# Patient Record
Sex: Male | Born: 1990 | Race: Black or African American | Hispanic: No | Marital: Single | Smoking: Current every day smoker
Health system: Southern US, Community
[De-identification: ages and names within clinical notes are randomized; demographics above are authoritative.]

## PROBLEM LIST (undated history)

## (undated) DIAGNOSIS — S62309A Unspecified fracture of unspecified metacarpal bone, initial encounter for closed fracture: Secondary | ICD-10-CM

## (undated) DIAGNOSIS — I213 ST elevation (STEMI) myocardial infarction of unspecified site: Secondary | ICD-10-CM

## (undated) DIAGNOSIS — M1990 Unspecified osteoarthritis, unspecified site: Secondary | ICD-10-CM

## (undated) DIAGNOSIS — I319 Disease of pericardium, unspecified: Secondary | ICD-10-CM

## (undated) HISTORY — PX: FRACTURE SURGERY: SHX138

---
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
## 2013-12-14 DIAGNOSIS — S62309A Unspecified fracture of unspecified metacarpal bone, initial encounter for closed fracture: Secondary | ICD-10-CM

## 2013-12-14 HISTORY — DX: Unspecified fracture of unspecified metacarpal bone, initial encounter for closed fracture: S62.309A

## 2013-12-15 ENCOUNTER — Encounter (HOSPITAL_BASED_OUTPATIENT_CLINIC_OR_DEPARTMENT_OTHER): Payer: Self-pay | Admitting: Emergency Medicine

## 2013-12-15 ENCOUNTER — Emergency Department (HOSPITAL_BASED_OUTPATIENT_CLINIC_OR_DEPARTMENT_OTHER): Payer: 59

## 2013-12-15 ENCOUNTER — Emergency Department (HOSPITAL_BASED_OUTPATIENT_CLINIC_OR_DEPARTMENT_OTHER)
Admission: EM | Admit: 2013-12-15 | Discharge: 2013-12-15 | Disposition: A | Discharge status: Home / Self Care | Payer: 59 | Attending: Emergency Medicine | Admitting: Emergency Medicine

## 2013-12-15 DIAGNOSIS — IMO0002 Reserved for concepts with insufficient information to code with codable children: Secondary | ICD-10-CM | POA: Insufficient documentation

## 2013-12-15 DIAGNOSIS — S62329A Displaced fracture of shaft of unspecified metacarpal bone, initial encounter for closed fracture: Secondary | ICD-10-CM | POA: Insufficient documentation

## 2013-12-15 DIAGNOSIS — Y929 Unspecified place or not applicable: Secondary | ICD-10-CM | POA: Insufficient documentation

## 2013-12-15 DIAGNOSIS — Y9389 Activity, other specified: Secondary | ICD-10-CM | POA: Insufficient documentation

## 2013-12-15 DIAGNOSIS — S62308A Unspecified fracture of other metacarpal bone, initial encounter for closed fracture: Secondary | ICD-10-CM

## 2013-12-15 MED ORDER — HYDROCODONE-ACETAMINOPHEN 5-325 MG PO TABS: 1.0000 | ORAL_TABLET | Freq: Four times a day (QID) | ORAL | Status: DC | PRN

## 2013-12-15 NOTE — Discharge Instructions (Signed)
Boxer's Fracture °You have a break (fracture) of the fifth metacarpal bone. This is commonly called a boxer's fracture. This is the bone in the hand where the little finger attaches. The fracture is in the end of that bone, closest to the little finger. It is usually caused when you hit an object with a clenched fist. Often, the knuckle is pushed down by the impact. Sometimes, the fracture rotates out of position. A boxer's fracture will usually heal within 6 weeks, if it is treated properly and protected from re-injury. Surgery is sometimes needed. °A cast, splint, or bulky hand dressing may be used to protect and immobilize a boxer's fracture. Do not remove this device or dressing until your caregiver approves. Keep your hand elevated, and apply ice packs for 15-20 minutes every 2 hours, for the first 2 days. Elevation and ice help reduce swelling and relieve pain. See your caregiver, or an orthopedic specialist, for follow-up care within the next 10 days. This is to make sure your fracture is healing properly. °Document Released: 08/14/2005 Document Revised: 11/06/2011 Document Reviewed: 02/01/2007 °ExitCare® Patient Information ©2014 [employer]. ° °
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
## 2013-12-24 ENCOUNTER — Encounter (HOSPITAL_BASED_OUTPATIENT_CLINIC_OR_DEPARTMENT_OTHER): Payer: Self-pay | Admitting: *Deleted

## 2013-12-26 ENCOUNTER — Other Ambulatory Visit: Payer: Self-pay | Admitting: Orthopedic Surgery

## 2013-12-30 ENCOUNTER — Emergency Department (HOSPITAL_BASED_OUTPATIENT_CLINIC_OR_DEPARTMENT_OTHER)
Admission: EM | Admit: 2013-12-30 | Discharge: 2013-12-30 | Disposition: A | Discharge status: Home / Self Care | Payer: 59 | Attending: Emergency Medicine | Admitting: Emergency Medicine

## 2013-12-30 ENCOUNTER — Encounter (HOSPITAL_BASED_OUTPATIENT_CLINIC_OR_DEPARTMENT_OTHER): Payer: Self-pay | Admitting: Emergency Medicine

## 2013-12-30 ENCOUNTER — Emergency Department (HOSPITAL_BASED_OUTPATIENT_CLINIC_OR_DEPARTMENT_OTHER): Payer: 59

## 2013-12-30 DIAGNOSIS — S161XXA Strain of muscle, fascia and tendon at neck level, initial encounter: Secondary | ICD-10-CM

## 2013-12-30 DIAGNOSIS — F17200 Nicotine dependence, unspecified, uncomplicated: Secondary | ICD-10-CM | POA: Insufficient documentation

## 2013-12-30 DIAGNOSIS — Y9389 Activity, other specified: Secondary | ICD-10-CM | POA: Insufficient documentation

## 2013-12-30 DIAGNOSIS — S139XXA Sprain of joints and ligaments of unspecified parts of neck, initial encounter: Secondary | ICD-10-CM | POA: Insufficient documentation

## 2013-12-30 DIAGNOSIS — Z8781 Personal history of (healed) traumatic fracture: Secondary | ICD-10-CM | POA: Insufficient documentation

## 2013-12-30 DIAGNOSIS — Y92410 Unspecified street and highway as the place of occurrence of the external cause: Secondary | ICD-10-CM | POA: Insufficient documentation

## 2013-12-30 MED ORDER — MELOXICAM 7.5 MG PO TABS: 7.5000 mg | ORAL_TABLET | Freq: Every day | ORAL | Status: DC

## 2013-12-30 MED ORDER — CYCLOBENZAPRINE HCL 10 MG PO TABS: 10.0000 mg | ORAL_TABLET | Freq: Two times a day (BID) | ORAL | Status: DC | PRN
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
## 2013-12-31 ENCOUNTER — Encounter (HOSPITAL_BASED_OUTPATIENT_CLINIC_OR_DEPARTMENT_OTHER): Payer: Self-pay | Admitting: *Deleted

## 2013-12-31 ENCOUNTER — Ambulatory Visit (HOSPITAL_BASED_OUTPATIENT_CLINIC_OR_DEPARTMENT_OTHER): Payer: 59 | Admitting: Anesthesiology

## 2013-12-31 ENCOUNTER — Ambulatory Visit (HOSPITAL_BASED_OUTPATIENT_CLINIC_OR_DEPARTMENT_OTHER)
Admission: RE | Admit: 2013-12-31 | Discharge: 2013-12-31 | Disposition: A | Discharge status: Home / Self Care | Payer: 59 | Source: Ambulatory Visit | Attending: Orthopedic Surgery | Admitting: Orthopedic Surgery

## 2013-12-31 ENCOUNTER — Encounter (HOSPITAL_BASED_OUTPATIENT_CLINIC_OR_DEPARTMENT_OTHER)
Admission: RE | Disposition: A | Discharge status: Home / Self Care | Payer: Self-pay | Source: Ambulatory Visit | Attending: Orthopedic Surgery

## 2013-12-31 ENCOUNTER — Encounter (HOSPITAL_BASED_OUTPATIENT_CLINIC_OR_DEPARTMENT_OTHER): Payer: 59 | Admitting: Anesthesiology

## 2013-12-31 DIAGNOSIS — X58XXXA Exposure to other specified factors, initial encounter: Secondary | ICD-10-CM | POA: Insufficient documentation

## 2013-12-31 DIAGNOSIS — S62309A Unspecified fracture of unspecified metacarpal bone, initial encounter for closed fracture: Secondary | ICD-10-CM | POA: Insufficient documentation

## 2013-12-31 DIAGNOSIS — F17200 Nicotine dependence, unspecified, uncomplicated: Secondary | ICD-10-CM | POA: Insufficient documentation

## 2013-12-31 HISTORY — DX: Unspecified fracture of unspecified metacarpal bone, initial encounter for closed fracture: S62.309A

## 2013-12-31 HISTORY — PX: OPEN REDUCTION INTERNAL FIXATION (ORIF) METACARPAL: SHX6234

## 2013-12-31 LAB — POCT HEMOGLOBIN-HEMACUE: Hemoglobin: 15.1 g/dL (ref 13.0–17.0)

## 2013-12-31 SURGERY — OPEN REDUCTION INTERNAL FIXATION (ORIF) METACARPAL
Anesthesia: General | Site: Finger | Laterality: Left

## 2013-12-31 MED ORDER — FENTANYL CITRATE 0.05 MG/ML IJ SOLN
INTRAMUSCULAR | Status: AC
  Filled 2013-12-31: qty 4

## 2013-12-31 MED ORDER — MEPERIDINE HCL 25 MG/ML IJ SOLN: 6.2500 mg | INTRAMUSCULAR | Status: DC | PRN

## 2013-12-31 MED ORDER — BUPIVACAINE HCL (PF) 0.25 % IJ SOLN
INTRAMUSCULAR | Status: DC | PRN
  Administered 2013-12-31: 10 mL

## 2013-12-31 MED ORDER — MIDAZOLAM HCL 2 MG/2ML IJ SOLN: 0.5000 mg | Freq: Once | INTRAMUSCULAR | Status: DC | PRN

## 2013-12-31 MED ORDER — LIDOCAINE HCL (PF) 1 % IJ SOLN
INTRAMUSCULAR | Status: AC
  Filled 2013-12-31: qty 150

## 2013-12-31 MED ORDER — MIDAZOLAM HCL 5 MG/5ML IJ SOLN
INTRAMUSCULAR | Status: DC | PRN
Start: 2013-12-31 — End: 2013-12-31
  Administered 2013-12-31: 2 mg via INTRAVENOUS

## 2013-12-31 MED ORDER — MIDAZOLAM HCL 2 MG/2ML IJ SOLN
INTRAMUSCULAR | Status: AC
  Filled 2013-12-31: qty 2

## 2013-12-31 MED ORDER — MIDAZOLAM HCL 2 MG/ML PO SYRP: 12.0000 mg | ORAL_SOLUTION | Freq: Once | ORAL | Status: DC | PRN

## 2013-12-31 MED ORDER — PROMETHAZINE HCL 25 MG/ML IJ SOLN: 6.2500 mg | INTRAMUSCULAR | Status: DC | PRN

## 2013-12-31 MED ORDER — CEFAZOLIN SODIUM-DEXTROSE 2-3 GM-% IV SOLR
2.0000 g | INTRAVENOUS | Status: AC
  Administered 2013-12-31: 2 g via INTRAVENOUS

## 2013-12-31 MED ORDER — LACTATED RINGERS IV SOLN
INTRAVENOUS | Status: DC
  Administered 2013-12-31 (×2): via INTRAVENOUS

## 2013-12-31 MED ORDER — LIDOCAINE HCL (CARDIAC) 20 MG/ML IV SOLN
INTRAVENOUS | Status: DC | PRN
  Administered 2013-12-31: 40 mg via INTRAVENOUS

## 2013-12-31 MED ORDER — PROPOFOL 10 MG/ML IV BOLUS
INTRAVENOUS | Status: AC
  Filled 2013-12-31: qty 80

## 2013-12-31 MED ORDER — MIDAZOLAM HCL 2 MG/2ML IJ SOLN: 1.0000 mg | INTRAMUSCULAR | Status: DC | PRN

## 2013-12-31 MED ORDER — CHLORHEXIDINE GLUCONATE 4 % EX LIQD: 60.0000 mL | Freq: Once | CUTANEOUS | Status: DC

## 2013-12-31 MED ORDER — DEXAMETHASONE SODIUM PHOSPHATE 10 MG/ML IJ SOLN
INTRAMUSCULAR | Status: DC | PRN
  Administered 2013-12-31: 10 mg via INTRAVENOUS

## 2013-12-31 MED ORDER — OXYCODONE HCL 5 MG PO TABS: 5.0000 mg | ORAL_TABLET | Freq: Once | ORAL | Status: DC | PRN

## 2013-12-31 MED ORDER — BUPIVACAINE HCL (PF) 0.25 % IJ SOLN
INTRAMUSCULAR | Status: AC
  Filled 2013-12-31: qty 150

## 2013-12-31 MED ORDER — EPHEDRINE SULFATE 50 MG/ML IJ SOLN
INTRAMUSCULAR | Status: DC | PRN
  Administered 2013-12-31: 10 mg via INTRAVENOUS

## 2013-12-31 MED ORDER — OXYCODONE HCL 5 MG/5ML PO SOLN: 5.0000 mg | Freq: Once | ORAL | Status: DC | PRN

## 2013-12-31 MED ORDER — FENTANYL CITRATE 0.05 MG/ML IJ SOLN
INTRAMUSCULAR | Status: DC | PRN
Start: 2013-12-31 — End: 2013-12-31
  Administered 2013-12-31 (×2): 25 ug via INTRAVENOUS
  Administered 2013-12-31: 100 ug via INTRAVENOUS

## 2013-12-31 MED ORDER — OXYCODONE-ACETAMINOPHEN 5-325 MG PO TABS: 1.0000 | ORAL_TABLET | ORAL | Status: DC | PRN

## 2013-12-31 MED ORDER — CEFAZOLIN SODIUM-DEXTROSE 2-3 GM-% IV SOLR
INTRAVENOUS | Status: AC
  Filled 2013-12-31: qty 50

## 2013-12-31 MED ORDER — HYDROMORPHONE HCL PF 1 MG/ML IJ SOLN: 0.2500 mg | INTRAMUSCULAR | Status: DC | PRN

## 2013-12-31 MED ORDER — FENTANYL CITRATE 0.05 MG/ML IJ SOLN: 50.0000 ug | INTRAMUSCULAR | Status: DC | PRN

## 2013-12-31 MED ORDER — PROPOFOL 10 MG/ML IV BOLUS
INTRAVENOUS | Status: DC | PRN
  Administered 2013-12-31: 200 mg via INTRAVENOUS

## 2013-12-31 MED ORDER — ONDANSETRON HCL 4 MG/2ML IJ SOLN
INTRAMUSCULAR | Status: DC | PRN
  Administered 2013-12-31: 4 mg via INTRAVENOUS

## 2013-12-31 SURGICAL SUPPLY — 71 items
APL SKNCLS STERI-STRIP NONHPOA (GAUZE/BANDAGES/DRESSINGS)
BANDAGE ELASTIC 3 VELCRO ST LF (GAUZE/BANDAGES/DRESSINGS) ×3 IMPLANT
BANDAGE ELASTIC 4 VELCRO ST LF (GAUZE/BANDAGES/DRESSINGS) IMPLANT
BENZOIN TINCTURE PRP APPL 2/3 (GAUZE/BANDAGES/DRESSINGS) IMPLANT
BIT DRILL 1.1 (BIT) ×2
BIT DRILL 1.1MM (BIT) ×1
BIT DRILL 60X20X1.1XQC TMX (BIT) ×1 IMPLANT
BIT DRL 60X20X1.1XQC TMX (BIT) ×1
BLADE 15 SAFETY STRL DISP (BLADE) ×6 IMPLANT
BNDG CMPR 9X4 STRL LF SNTH (GAUZE/BANDAGES/DRESSINGS) ×1
BNDG CMPR MD 5X2 ELC HKLP STRL (GAUZE/BANDAGES/DRESSINGS)
BNDG ELASTIC 2 VLCR STRL LF (GAUZE/BANDAGES/DRESSINGS) IMPLANT
BNDG ESMARK 4X9 LF (GAUZE/BANDAGES/DRESSINGS) ×3 IMPLANT
BNDG GAUZE ELAST 4 BULKY (GAUZE/BANDAGES/DRESSINGS) IMPLANT
CANISTER SUCT 1200ML W/VALVE (MISCELLANEOUS) IMPLANT
CLOSURE WOUND 1/2 X4 (GAUZE/BANDAGES/DRESSINGS)
CORDS BIPOLAR (ELECTRODE) ×3 IMPLANT
COVER TABLE BACK 60X90 (DRAPES) ×3 IMPLANT
CUFF TOURNIQUET SINGLE 18IN (TOURNIQUET CUFF) ×3 IMPLANT
DECANTER SPIKE VIAL GLASS SM (MISCELLANEOUS) IMPLANT
DRAPE EXTREMITY T 121X128X90 (DRAPE) ×3 IMPLANT
DRAPE OEC MINIVIEW 54X84 (DRAPES) ×3 IMPLANT
DRAPE SURG 17X23 STRL (DRAPES) ×3 IMPLANT
DURAPREP 26ML APPLICATOR (WOUND CARE) ×3 IMPLANT
GAUZE SPONGE 4X4 12PLY STRL (GAUZE/BANDAGES/DRESSINGS) ×3 IMPLANT
GAUZE SPONGE 4X4 16PLY XRAY LF (GAUZE/BANDAGES/DRESSINGS) IMPLANT
GAUZE XEROFORM 1X8 LF (GAUZE/BANDAGES/DRESSINGS) IMPLANT
GLOVE BIO SURGEON STRL SZ 6.5 (GLOVE) ×2 IMPLANT
GLOVE BIO SURGEONS STRL SZ 6.5 (GLOVE) ×1
GLOVE BIOGEL PI IND STRL 7.0 (GLOVE) ×1 IMPLANT
GLOVE BIOGEL PI INDICATOR 7.0 (GLOVE) ×2
GLOVE SURG SYN 8.0 (GLOVE) ×6 IMPLANT
GOWN STRL REUS W/ TWL LRG LVL3 (GOWN DISPOSABLE) ×1 IMPLANT
GOWN STRL REUS W/TWL LRG LVL3 (GOWN DISPOSABLE) ×3
GOWN STRL REUS W/TWL XL LVL3 (GOWN DISPOSABLE) ×3 IMPLANT
NEEDLE HYPO 25X1 1.5 SAFETY (NEEDLE) IMPLANT
NS IRRIG 1000ML POUR BTL (IV SOLUTION) ×3 IMPLANT
PACK BASIN DAY SURGERY FS (CUSTOM PROCEDURE TRAY) ×3 IMPLANT
PAD CAST 3X4 CTTN HI CHSV (CAST SUPPLIES) ×1 IMPLANT
PAD CAST 4YDX4 CTTN HI CHSV (CAST SUPPLIES) IMPLANT
PADDING CAST ABS 4INX4YD NS (CAST SUPPLIES)
PADDING CAST ABS COTTON 4X4 ST (CAST SUPPLIES) IMPLANT
PADDING CAST COTTON 3X4 STRL (CAST SUPPLIES) ×3
PADDING CAST COTTON 4X4 STRL (CAST SUPPLIES)
PADDING UNDERCAST 2 STRL (CAST SUPPLIES) ×2
PADDING UNDERCAST 2X4 STRL (CAST SUPPLIES) ×1 IMPLANT
PLATE STRAIGHT LOCK 1.5 (Plate) ×3 IMPLANT
SCREW CORTICAL 1.5X9MM WRIST (Screw) ×6 IMPLANT
SCREW NL 1.5X11 WRIST (Screw) ×3 IMPLANT
SCREW NONIOC 1.5 10M (Screw) ×9 IMPLANT
SHEET MEDIUM DRAPE 40X70 STRL (DRAPES) ×3 IMPLANT
SPLINT PLASTER CAST XFAST 4X15 (CAST SUPPLIES) ×15 IMPLANT
SPLINT PLASTER XTRA FAST SET 4 (CAST SUPPLIES) ×30
STOCKINETTE 4X48 STRL (DRAPES) ×3 IMPLANT
STRIP CLOSURE SKIN 1/2X4 (GAUZE/BANDAGES/DRESSINGS) IMPLANT
SUCTION FRAZIER TIP 10 FR DISP (SUCTIONS) IMPLANT
SUT ETHILON 4 0 PS 2 18 (SUTURE) IMPLANT
SUT ETHILON 5 0 PS 2 18 (SUTURE) IMPLANT
SUT MERSILENE 4 0 P 3 (SUTURE) IMPLANT
SUT VIC AB 2-0 PS2 27 (SUTURE) ×3 IMPLANT
SUT VIC AB 4-0 P-3 18XBRD (SUTURE) IMPLANT
SUT VIC AB 4-0 P3 18 (SUTURE)
SUT VICRYL 4-0 PS2 18IN ABS (SUTURE) ×3 IMPLANT
SUT VICRYL RAPIDE 4-0 (SUTURE) ×3 IMPLANT
SUT VICRYL RAPIDE 4/0 PS 2 (SUTURE) IMPLANT
SYR BULB 3OZ (MISCELLANEOUS) IMPLANT
SYRINGE 10CC LL (SYRINGE) IMPLANT
TOWEL OR 17X24 6PK STRL BLUE (TOWEL DISPOSABLE) ×3 IMPLANT
TUBE CONNECTING 20'X1/4 (TUBING)
TUBE CONNECTING 20X1/4 (TUBING) IMPLANT
UNDERPAD 30X30 INCONTINENT (UNDERPADS AND DIAPERS) ×3 IMPLANT

## 2013-12-31 NOTE — Op Note (Signed)
See note [phone]

## 2013-12-31 NOTE — Discharge Instructions (Signed)
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
## 2013-12-31 NOTE — Anesthesia Postprocedure Evaluation (Signed)
  Anesthesia Post-op Note  Patient: Donald Buck  Procedure(s) Performed: Procedure(s): OPEN REDUCTION INTERNAL FIXATION (ORIF) LEFT SMALL FINGER METACARPAL FRACTURE (Left)  Patient Location: PACU  Anesthesia Type:General  Level of Consciousness: awake, alert , oriented and patient cooperative  Airway and Oxygen Therapy: Patient Spontanous Breathing  Post-op Pain: none  Post-op Assessment: Post-op Vital signs reviewed, Patient's Cardiovascular Status Stable, Respiratory Function Stable, Patent Airway, No signs of Nausea or vomiting and Pain level controlled  Post-op Vital Signs: Reviewed and stable  Last Vitals:  Filed Vitals:   12/31/13 1042  BP: 128/81  Pulse: 74  Temp: 36.8 C  Resp: 16    Complications: No apparent anesthesia complications

## 2013-12-31 NOTE — Anesthesia Preprocedure Evaluation (Addendum)
Anesthesia Evaluation  Patient identified by MRN, date of birth, ID band Patient awake    Reviewed: Allergy & Precautions, H&P , NPO status , Patient's Chart, lab work & pertinent test results  History of Anesthesia Complications Negative for: history of anesthetic complications  Airway Mallampati: I TM Distance: >3 FB Neck ROM: Full    Dental  (+) Teeth Intact, Dental Advisory Given   Pulmonary Current Smoker,  breath sounds clear to auscultation  Pulmonary exam normal       Cardiovascular negative cardio ROS  Rhythm:Regular Rate:Normal     Neuro/Psych negative neurological ROS  negative psych ROS   GI/Hepatic negative GI ROS, Neg liver ROS,   Endo/Other  negative endocrine ROS  Renal/GU negative Renal ROS     Musculoskeletal   Abdominal   Peds  Hematology negative hematology ROS (+)   Anesthesia Other Findings   Reproductive/Obstetrics                          Anesthesia Physical Anesthesia Plan  ASA: II  Anesthesia Plan: General   Post-op Pain Management:    Induction: Intravenous  Airway Management Planned: LMA  Additional Equipment:   Intra-op Plan:   Post-operative Plan:   Informed Consent: I have reviewed the patients History and Physical, chart, labs and discussed the procedure including the risks, benefits and alternatives for the proposed anesthesia with the patient or authorized representative who has indicated his/her understanding and acceptance.   Dental advisory given  Plan Discussed with: CRNA and Surgeon  Anesthesia Plan Comments: (Plan routine monitors, GA- LMA OK)        Anesthesia Quick Evaluation

## 2013-12-31 NOTE — Anesthesia Procedure Notes (Signed)
Procedure Name: LMA Insertion Date/Time: 12/31/2013 8:37 AM Performed by: Burna CashONRAD, Corbet Hanley C Pre-anesthesia Checklist: Patient identified, Emergency Drugs available, Suction available and Patient being monitored Patient Re-evaluated:Patient Re-evaluated prior to inductionOxygen Delivery Method: Circle System Utilized Preoxygenation: Pre-oxygenation with 100% oxygen Intubation Type: IV induction Ventilation: Mask ventilation without difficulty LMA: LMA inserted LMA Size: 5.0 Number of attempts: 1 Airway Equipment and Method: bite block Placement Confirmation: positive ETCO2 Tube secured with: Tape Dental Injury: Teeth and Oropharynx as per pre-operative assessment

## 2013-12-31 NOTE — Transfer of Care (Signed)
Immediate Anesthesia Transfer of Care Note  Patient: Donald Buck  Procedure(s) Performed: Procedure(s): OPEN REDUCTION INTERNAL FIXATION (ORIF) LEFT SMALL FINGER METACARPAL FRACTURE (Left)  Patient Location: PACU  Anesthesia Type:General  Level of Consciousness: sedated  Airway & Oxygen Therapy: Patient Spontanous Breathing and Patient connected to face mask oxygen  Post-op Assessment: Report given to PACU RN and Post -op Vital signs reviewed and stable  Post vital signs: Reviewed and stable  Complications: No apparent anesthesia complications

## 2014-01-01 ENCOUNTER — Encounter (HOSPITAL_BASED_OUTPATIENT_CLINIC_OR_DEPARTMENT_OTHER): Payer: Self-pay | Admitting: Orthopedic Surgery

## 2014-01-01 NOTE — Op Note (Signed)
NAME:  Donald Buck, Donald Buck                ACCOUNT NO.:  [id number]  MEDICAL RECORD NO.:  [id number]  LOCATION:                               FACILITY:  [facility]  PHYSICIAN:  Artist PaisMatthew A. Pax Reasoner, M.D.DATE OF BIRTH:  16-Nov-1990  DATE OF PROCEDURE:  12/31/2013 DATE OF DISCHARGE:  12/31/2013                              OPERATIVE REPORT   PREOPERATIVE DIAGNOSIS:  Displaced right small finger metacarpal fracture.  POSTOPERATIVE DIAGNOSIS:  Displaced right small finger metacarpal fracture.  PROCEDURE:  Open reduction and internal fixation of above with 1.5 mm six-hole compression plating.  SURGEON:  Artist PaisMatthew A. Mina MarbleWeingold, M.D.  ASSISTANT:  None.  ANESTHESIA:  General.  COMPLICATION:  None.  DRAINS:  None.  DESCRIPTION OF PROCEDURE:  The patient was taken to the operating suite. After induction of adequate general anesthesia, right upper extremity was prepped and draped in usual sterile fashion.  An Esmarch was used to exsanguinate the limb.  Tourniquet was inflated to 250 mmHg.  At this point in time, incision was made over the mid shaft area of the small metacarpal.  Skin was incised 3-4 cm.  Dissection was carried down to the EDQ and EDC to the small.  These were carefully retracted radially. Subperiosteal dissection of the fracture site was undertaken.  The fracture was debrided of clot, reduced with downward pressure.  However, the reduction clamp at this point in time is 6 hole.  A 1.5-mm straight plate was placed dorsal ulnarly with 3 screws above and 3 screws below the fracture site.  These were placed under direct and fluoroscopic guidance.  The intraoperative fluoroscopy revealed adequate reduction in AP, lateral, and oblique view.  The wound was thoroughly irrigated and loosely closed in layers of 2-0 undyed Vicryl for the periosteum and a 4- 0 Vicryl Rapide subcuticular stitch on the skin.  Steri-Strips, 4x4s, fluffs, and a ulnar gutter splint was applied.  The patient  tolerated the procedure well in a concealed fashion.     Artist PaisMatthew A. Mina MarbleWeingold, M.D.    MAW/MEDQ  D:  12/31/2013  T:  01/01/2014  Job:  [id number]

## 2014-01-01 NOTE — Op Note (Deleted)
NAME:  Donald Buck, Donald Buck                ACCOUNT NO.:  [id number]  MEDICAL RECORD NO.:  [id number]  LOCATION:                               FACILITY:  [facility]  PHYSICIAN:  Adaliz Dobis A. Kalasia Crafton, M.D.DATE OF BIRTH:  01/14/1991  DATE OF PROCEDURE:  12/31/2013 DATE OF DISCHARGE:  12/31/2013                              OPERATIVE REPORT   PREOPERATIVE DIAGNOSIS:  Displaced right small finger metacarpal fracture.  POSTOPERATIVE DIAGNOSIS:  Displaced right small finger metacarpal fracture.  PROCEDURE:  Open reduction and internal fixation of above with 1.5 mm six-hole compression plating.  SURGEON:  Jenice Leiner A. Stanisha Lorenz, M.D.  ASSISTANT:  None.  ANESTHESIA:  General.  COMPLICATION:  None.  DRAINS:  None.  DESCRIPTION OF PROCEDURE:  The patient was taken to the operating suite. After induction of adequate general anesthesia, right upper extremity was prepped and draped in usual sterile fashion.  An Esmarch was used to exsanguinate the limb.  Tourniquet was inflated to 250 mmHg.  At this point in time, incision was made over the mid shaft area of the small metacarpal.  Skin was incised 3-4 cm.  Dissection was carried down to the EDQ and EDC to the small.  These were carefully retracted radially. Subperiosteal dissection of the fracture site was undertaken.  The fracture was debrided of clot, reduced with downward pressure.  However, the reduction clamp at this point in time is 6 hole.  A 1.5-mm straight plate was placed dorsal ulnarly with 3 screws above and 3 screws below the fracture site.  These were placed under direct and fluoroscopic guidance.  The intraoperative fluoroscopy revealed adequate reduction in AP, lateral, and oblique view.  The wound was thoroughly irrigated and loosely closed in layers of 2-0 undyed Vicryl for the periosteum and a 4- 0 Vicryl Rapide subcuticular stitch on the skin.  Steri-Strips, 4x4s, fluffs, and a ulnar gutter splint was applied.  The patient  tolerated the procedure well in a concealed fashion.     Estela Vinal A. Kohen Reither, M.D.    MAW/MEDQ  D:  12/31/2013  T:  01/01/2014  Job:  [id number] 

## 2014-01-03 NOTE — ED Provider Notes (Signed)
Medical screening examination/treatment/procedure(s) were performed by non-physician practitioner and as supervising physician I was immediately available for consultation/collaboration.     Jenah Vanasten, MD 01/03/14 1932 

## 2016-05-22 ENCOUNTER — Emergency Department (HOSPITAL_BASED_OUTPATIENT_CLINIC_OR_DEPARTMENT_OTHER): Payer: Self-pay

## 2016-05-22 ENCOUNTER — Emergency Department (HOSPITAL_BASED_OUTPATIENT_CLINIC_OR_DEPARTMENT_OTHER)
Admission: EM | Admit: 2016-05-22 | Discharge: 2016-05-22 | Disposition: A | Discharge status: Home / Self Care | Payer: Self-pay | Attending: Emergency Medicine | Admitting: Emergency Medicine

## 2016-05-22 ENCOUNTER — Encounter (HOSPITAL_BASED_OUTPATIENT_CLINIC_OR_DEPARTMENT_OTHER): Payer: Self-pay | Admitting: *Deleted

## 2016-05-22 DIAGNOSIS — M25562 Pain in left knee: Secondary | ICD-10-CM | POA: Insufficient documentation

## 2016-05-22 DIAGNOSIS — F17290 Nicotine dependence, other tobacco product, uncomplicated: Secondary | ICD-10-CM | POA: Insufficient documentation

## 2016-05-22 MED ORDER — IBUPROFEN 400 MG PO TABS
600.0000 mg | ORAL_TABLET | Freq: Once | ORAL | Status: AC
  Administered 2016-05-22: 600 mg via ORAL
  Filled 2016-05-22: qty 1

## 2016-05-22 NOTE — ED Triage Notes (Signed)
Left knee pain x 2-3 days.  Hx of surgery 3 years ago.

## 2016-05-22 NOTE — ED Provider Notes (Signed)
[facility] Provider Note   CSN: [id number] Arrival date & time: 05/22/16  1413 By signing my name below, I, Bridgette HabermannMaria Tan, attest that this documentation has Buck prepared under the direction and in the presence of Tomasita CrumbleAdeleke Jaicion Laurie, MD. Electronically Signed: Bridgette HabermannMaria Tan, ED Scribe. 05/22/16. 3:34 PM.  History   Chief Complaint Chief Complaint  Patient presents with  . Knee Pain   HPI Comments: Donald Buck is a 25 y.o. male who presents to the Emergency Department complaining of aching, left knee pain s/p mechanical injury 3 days ago. Pain is exacerbated with movement and ambulating. No alleviating factors noted. Pt has h/o surgery to the area 3 years ago. Pt denies numbness, fever, or any other associated symptoms.  The history is provided by the patient. No language interpreter was used.    Past Medical History:  Diagnosis Date  . Metacarpal bone fracture 12/14/2013   left small    There are no active problems to display for this patient.   Past Surgical History:  Procedure Laterality Date  . KNEE ARTHROSCOPY W/ ACL RECONSTRUCTION Left    and meniscus repair  . OPEN REDUCTION INTERNAL FIXATION (ORIF) METACARPAL Left 12/31/2013   Procedure: OPEN REDUCTION INTERNAL FIXATION (ORIF) LEFT SMALL FINGER METACARPAL FRACTURE;  Surgeon: Marlowe ShoresMatthew A Weingold, MD;  Location: [facility];  Service: Orthopedics;  Laterality: Left;       Home Medications    Prior to Admission medications   Not on File    Family History History reviewed. No pertinent family history.  Social History Social History  Substance Use Topics  . Smoking status: Current Every Day Smoker    Years: 3.00    Types: Cigars  . Smokeless tobacco: Never Used     Comment: 1-2 Black and Mild/day  . Alcohol use No     Allergies   Review of patient's allergies indicates no known allergies.   Review of Systems Review of Systems  10 Systems reviewed and all are negative for acute change  except as noted in the HPI. Physical Exam Updated Vital Signs BP 143/92 (BP Location: Left Arm)   Pulse 62   Temp 98.1 F (36.7 C) (Oral)   Resp 20   Ht 6\' 3"  (1.905 m)   Wt 170 lb (77.1 kg)   SpO2 100%   BMI 21.25 kg/m   Physical Exam  Constitutional: He is oriented to person, place, and time. Vital signs are normal. He appears well-developed and well-nourished.  Non-toxic appearance. He does not appear ill. No distress.  HENT:  Head: Normocephalic and atraumatic.  Nose: Nose normal.  Mouth/Throat: Oropharynx is clear and moist. No oropharyngeal exudate.  Eyes: Conjunctivae and EOM are normal. Pupils are equal, round, and reactive to light. No scleral icterus.  Neck: Normal range of motion. Neck supple. No tracheal deviation, no edema, no erythema and normal range of motion present. No thyroid mass and no thyromegaly present.  Cardiovascular: Normal rate, regular rhythm, S1 normal, S2 normal, normal heart sounds, intact distal pulses and normal pulses.  Exam reveals no gallop and no friction rub.   No murmur heard. Pulmonary/Chest: Effort normal and breath sounds normal. No respiratory distress. He has no wheezes. He has no rhonchi. He has no rales.  Abdominal: Soft. Normal appearance and bowel sounds are normal. He exhibits no distension, no ascites and no mass. There is no hepatosplenomegaly. There is no tenderness. There is no rebound, no guarding and no CVA tenderness.  Musculoskeletal: Normal range  of motion. He exhibits no edema.  Mild joint laxity in the left knee. No joint effusion.  Lymphadenopathy:    He has no cervical adenopathy.  Neurological: He is alert and oriented to person, place, and time. He has normal strength. No cranial nerve deficit or sensory deficit.  Skin: Skin is warm, dry and intact. No petechiae and no rash noted. He is not diaphoretic. No erythema. No pallor.  Nursing note and vitals reviewed.    ED Treatments / Results  DIAGNOSTIC STUDIES: Oxygen  Saturation is 100% on RA, normal by my interpretation.    COORDINATION OF CARE: 3:33 PM Discussed treatment plan with pt at bedside which includes x-ray and pt agreed to plan.  Labs (all labs ordered are listed, but only abnormal results are displayed) Labs Reviewed - No data to display  EKG  EKG Interpretation None       Radiology Dg Knee Complete 4 Views Left  Result Date: 05/22/2016 CLINICAL DATA:  Medial knee joint pain and hand pain with walking after trauma against door 2-3 days ago. Status post ACL repair 4 years ago. EXAM: LEFT KNEE - COMPLETE 4+ VIEW COMPARISON:  None. FINDINGS: Ghost tracts from ACL repair noted. Single femoral screw is identified in the distal femoral diaphysis. Additional ghost tract is seen in the medial aspect of the proximal tibial diametaphyseal. No acute fracture noted. 7 x 4 mm ossification adjacent to the medial tibial spine likely represents a small intra-articular loose body. Mild pretibial soft tissue thickening is seen. No joint effusion. IMPRESSION: Status post ACL reconstruction without evidence of acute fracture. 7 x 4 mm ossification adjacent to the medial tibial spine likely represents a small intra-articular loose body. Electronically Signed   By: Tollie Eth M.D.   On: 05/22/2016 16:07    Procedures Procedures (including critical care time)  Medications Ordered in ED Medications  ibuprofen (ADVIL,MOTRIN) tablet 600 mg (600 mg Oral Given 05/22/16 1545)     Initial Impression / Assessment and Plan / ED Course  I have reviewed the triage vital signs and the nursing notes.  Pertinent labs & imaging results that were available during my care of the patient were reviewed by me and considered in my medical decision making (see chart for details).  Clinical Course    Patient presents to the ED for knee pain after trauma.  This is in the setting of playing basketball and injuring it.  Physical exam shows some laxaity of the joint, concerning  for a repeat tear.  He was advised to follow up with ortho for MRI and further evaluation.  Plan for tylenol and ibuprofen at home as needed.  Ice packs for swelling.  Xray negative for new acute injury.  He appears well and in NAD.  VS remain within his normal limits and he is safe for DC.  Final Clinical Impressions(s) / ED Diagnoses   Final diagnoses:  None   I personally performed the services described in this documentation, which was scribed in my presence. The recorded information has Buck reviewed and is accurate.     New Prescriptions New Prescriptions   No medications on file     Tomasita Crumble, MD 05/22/16 1614

## 2016-05-22 NOTE — ED Notes (Signed)
Pt d/c home with friend. Follow up care discussed

## 2016-05-22 NOTE — ED Triage Notes (Signed)
Hit knee on door and played basketball

## 2018-03-06 ENCOUNTER — Other Ambulatory Visit: Payer: Self-pay

## 2018-03-06 ENCOUNTER — Encounter (HOSPITAL_COMMUNITY)
Admission: EM | Disposition: A | Discharge status: Home / Self Care | Payer: Self-pay | Source: Home / Self Care | Attending: Cardiology

## 2018-03-06 ENCOUNTER — Inpatient Hospital Stay (HOSPITAL_BASED_OUTPATIENT_CLINIC_OR_DEPARTMENT_OTHER)
Admission: EM | Admit: 2018-03-06 | Discharge: 2018-03-07 | DRG: 282 | Disposition: A | Discharge status: Home / Self Care | Payer: Self-pay | Attending: Cardiology | Admitting: Cardiology

## 2018-03-06 ENCOUNTER — Encounter (HOSPITAL_BASED_OUTPATIENT_CLINIC_OR_DEPARTMENT_OTHER): Payer: Self-pay | Admitting: *Deleted

## 2018-03-06 ENCOUNTER — Emergency Department (HOSPITAL_BASED_OUTPATIENT_CLINIC_OR_DEPARTMENT_OTHER): Payer: Self-pay

## 2018-03-06 DIAGNOSIS — R9431 Abnormal electrocardiogram [ECG] [EKG]: Secondary | ICD-10-CM | POA: Diagnosis present

## 2018-03-06 DIAGNOSIS — Z23 Encounter for immunization: Secondary | ICD-10-CM

## 2018-03-06 DIAGNOSIS — Z79899 Other long term (current) drug therapy: Secondary | ICD-10-CM

## 2018-03-06 DIAGNOSIS — I213 ST elevation (STEMI) myocardial infarction of unspecified site: Principal | ICD-10-CM

## 2018-03-06 DIAGNOSIS — R001 Bradycardia, unspecified: Secondary | ICD-10-CM | POA: Diagnosis present

## 2018-03-06 DIAGNOSIS — Z720 Tobacco use: Secondary | ICD-10-CM

## 2018-03-06 HISTORY — DX: ST elevation (STEMI) myocardial infarction of unspecified site: I21.3

## 2018-03-06 HISTORY — DX: Unspecified osteoarthritis, unspecified site: M19.90

## 2018-03-06 HISTORY — PX: LEFT HEART CATH AND CORONARY ANGIOGRAPHY: CATH118249

## 2018-03-06 LAB — COMPREHENSIVE METABOLIC PANEL
ALT: 14 U/L (ref 0–44)
AST: 19 U/L (ref 15–41)
Albumin: 4.3 g/dL (ref 3.5–5.0)
Alkaline Phosphatase: 49 U/L (ref 38–126)
Anion gap: 7 (ref 5–15)
BUN: 13 mg/dL (ref 6–20)
CALCIUM: 9 mg/dL (ref 8.9–10.3)
CO2: 24 mmol/L (ref 22–32)
Chloride: 107 mmol/L (ref 98–111)
Creatinine, Ser: 0.93 mg/dL (ref 0.61–1.24)
GFR calc Af Amer: >60 mL/min (ref >60)
Glucose, Bld: 86 mg/dL (ref 70–99)
Potassium: 3.4 mmol/L — ABNORMAL LOW (ref 3.5–5.1)
Sodium: 138 mmol/L (ref 135–145)
TOTAL PROTEIN: 7.7 g/dL (ref 6.5–8.1)
Total Bilirubin: 1.6 mg/dL — ABNORMAL HIGH (ref 0.3–1.2)

## 2018-03-06 LAB — LIPID PANEL
CHOL/HDL RATIO: 3.7 ratio
CHOLESTEROL: 187 mg/dL (ref 0–200)
HDL: 51 mg/dL (ref >40)
LDL Cholesterol: 127 mg/dL — ABNORMAL HIGH (ref 0–99)
Triglycerides: 47 mg/dL (ref <150)
VLDL: 9 mg/dL (ref 0–40)

## 2018-03-06 LAB — CBC WITH DIFFERENTIAL/PLATELET
BASOS PCT: 0 %
Basophils Absolute: 0 10*3/uL (ref 0.0–0.1)
EOS PCT: 1 %
Eosinophils Absolute: 0.1 10*3/uL (ref 0.0–0.7)
HEMATOCRIT: 42.2 % (ref 39.0–52.0)
Hemoglobin: 15.6 g/dL (ref 13.0–17.0)
Lymphocytes Relative: 17 %
Lymphs Abs: 1.2 10*3/uL (ref 0.7–4.0)
MCH: 34.1 pg — AB (ref 26.0–34.0)
MCHC: 37 g/dL — AB (ref 30.0–36.0)
MCV: 92.1 fL (ref 78.0–100.0)
MONOS PCT: 5 %
Monocytes Absolute: 0.3 10*3/uL (ref 0.1–1.0)
NEUTROS ABS: 5.3 10*3/uL (ref 1.7–7.7)
Neutrophils Relative %: 77 %
Platelets: 242 10*3/uL (ref 150–400)
RBC: 4.58 MIL/uL (ref 4.22–5.81)
RDW: 11.2 % — AB (ref 11.5–15.5)
WBC: 6.9 10*3/uL (ref 4.0–10.5)

## 2018-03-06 LAB — POCT I-STAT, CHEM 8
BUN: 12 mg/dL (ref 6–20)
CREATININE: 0.9 mg/dL (ref 0.61–1.24)
Calcium, Ion: 1.19 mmol/L (ref 1.15–1.40)
Chloride: 105 mmol/L (ref 98–111)
Glucose, Bld: 113 mg/dL — ABNORMAL HIGH (ref 70–99)
HEMATOCRIT: 43 % (ref 39.0–52.0)
HEMOGLOBIN: 14.6 g/dL (ref 13.0–17.0)
POTASSIUM: 3 mmol/L — AB (ref 3.5–5.1)
SODIUM: 141 mmol/L (ref 135–145)
TCO2: 20 mmol/L — AB (ref 22–32)

## 2018-03-06 LAB — PROTIME-INR
INR: 1.15
PROTHROMBIN TIME: 14.6 s (ref 11.4–15.2)

## 2018-03-06 LAB — TROPONIN I

## 2018-03-06 LAB — APTT: aPTT: 27 seconds (ref 24–36)

## 2018-03-06 SURGERY — LEFT HEART CATH AND CORONARY ANGIOGRAPHY
Anesthesia: LOCAL

## 2018-03-06 MED ORDER — IOPAMIDOL (ISOVUE-370) INJECTION 76%
INTRAVENOUS | Status: DC | PRN
  Administered 2018-03-06: 85 mL via INTRA_ARTERIAL

## 2018-03-06 MED ORDER — POTASSIUM CHLORIDE CRYS ER 20 MEQ PO TBCR
40.0000 meq | EXTENDED_RELEASE_TABLET | Freq: Once | ORAL | Status: AC
  Administered 2018-03-06: 40 meq via ORAL
  Filled 2018-03-06: qty 2

## 2018-03-06 MED ORDER — NICOTINE 7 MG/24HR TD PT24
7.0000 mg | MEDICATED_PATCH | Freq: Every day | TRANSDERMAL | Status: DC
  Administered 2018-03-06: 18:00:00 7 mg via TRANSDERMAL
  Filled 2018-03-06 (×2): qty 1

## 2018-03-06 MED ORDER — SODIUM CHLORIDE 0.9 % IV SOLN
INTRAVENOUS | Status: DC
  Administered 2018-03-06: 10:00:00 via INTRAVENOUS

## 2018-03-06 MED ORDER — LIDOCAINE HCL (PF) 1 % IJ SOLN
INTRAMUSCULAR | Status: AC
  Filled 2018-03-06: qty 30

## 2018-03-06 MED ORDER — IOPAMIDOL (ISOVUE-370) INJECTION 76%
INTRAVENOUS | Status: AC
  Filled 2018-03-06: qty 125

## 2018-03-06 MED ORDER — ASPIRIN 81 MG PO CHEW
324.0000 mg | CHEWABLE_TABLET | Freq: Once | ORAL | Status: DC
  Filled 2018-03-06: qty 4

## 2018-03-06 MED ORDER — SODIUM CHLORIDE 0.9 % IV SOLN
250.0000 mL | INTRAVENOUS | Status: DC | PRN
Start: 2018-03-06 — End: 2018-03-07

## 2018-03-06 MED ORDER — PNEUMOCOCCAL VAC POLYVALENT 25 MCG/0.5ML IJ INJ
0.5000 mL | INJECTION | INTRAMUSCULAR | Status: AC
  Administered 2018-03-07: 13:00:00 0.5 mL via INTRAMUSCULAR
  Filled 2018-03-06: qty 0.5

## 2018-03-06 MED ORDER — DILTIAZEM HCL 60 MG PO TABS
60.0000 mg | ORAL_TABLET | Freq: Two times a day (BID) | ORAL | Status: DC
  Administered 2018-03-06 – 2018-03-07 (×2): 60 mg via ORAL
  Filled 2018-03-06 (×3): qty 1

## 2018-03-06 MED ORDER — ASPIRIN 81 MG PO CHEW
324.0000 mg | CHEWABLE_TABLET | Freq: Once | ORAL | Status: AC
  Administered 2018-03-06: 324 mg via ORAL

## 2018-03-06 MED ORDER — MIDAZOLAM HCL 2 MG/2ML IJ SOLN
INTRAMUSCULAR | Status: DC | PRN
  Administered 2018-03-06: 2 mg via INTRAVENOUS

## 2018-03-06 MED ORDER — MORPHINE SULFATE (PF) 2 MG/ML IV SOLN: 1.0000 mg | INTRAVENOUS | Status: DC | PRN

## 2018-03-06 MED ORDER — FENTANYL CITRATE (PF) 100 MCG/2ML IJ SOLN
INTRAMUSCULAR | Status: AC
  Filled 2018-03-06: qty 2

## 2018-03-06 MED ORDER — ACETAMINOPHEN 325 MG PO TABS: 650.0000 mg | ORAL_TABLET | ORAL | Status: DC | PRN

## 2018-03-06 MED ORDER — SODIUM CHLORIDE 0.9 % IV SOLN
INTRAVENOUS | Status: AC
  Administered 2018-03-06: 300 mL via INTRAVENOUS

## 2018-03-06 MED ORDER — POTASSIUM CHLORIDE CRYS ER 20 MEQ PO TBCR
EXTENDED_RELEASE_TABLET | ORAL | Status: AC
  Filled 2018-03-06: qty 2

## 2018-03-06 MED ORDER — MIDAZOLAM HCL 2 MG/2ML IJ SOLN
INTRAMUSCULAR | Status: AC
  Filled 2018-03-06: qty 2

## 2018-03-06 MED ORDER — VERAPAMIL HCL 2.5 MG/ML IV SOLN
INTRAVENOUS | Status: DC | PRN
  Administered 2018-03-06: 11:00:00 via INTRA_ARTERIAL

## 2018-03-06 MED ORDER — HEPARIN SODIUM (PORCINE) 1000 UNIT/ML IJ SOLN
INTRAMUSCULAR | Status: AC
  Filled 2018-03-06: qty 1

## 2018-03-06 MED ORDER — HEPARIN SODIUM (PORCINE) 1000 UNIT/ML IJ SOLN
INTRAMUSCULAR | Status: DC | PRN
  Administered 2018-03-06: 4000 [IU] via INTRAVENOUS

## 2018-03-06 MED ORDER — HEPARIN SODIUM (PORCINE) 5000 UNIT/ML IJ SOLN
4000.0000 [IU] | Freq: Once | INTRAMUSCULAR | Status: AC
  Administered 2018-03-06: 4000 [IU] via INTRAVENOUS
  Filled 2018-03-06: qty 1

## 2018-03-06 MED ORDER — FENTANYL CITRATE (PF) 100 MCG/2ML IJ SOLN
INTRAMUSCULAR | Status: DC | PRN
  Administered 2018-03-06: 25 ug via INTRAVENOUS

## 2018-03-06 MED ORDER — VERAPAMIL HCL 2.5 MG/ML IV SOLN
INTRAVENOUS | Status: AC
  Filled 2018-03-06: qty 2

## 2018-03-06 MED ORDER — NITROGLYCERIN 1 MG/10 ML FOR IR/CATH LAB
INTRA_ARTERIAL | Status: AC
  Filled 2018-03-06: qty 10

## 2018-03-06 MED ORDER — HEPARIN (PORCINE) IN NACL 1000-0.9 UT/500ML-% IV SOLN
INTRAVENOUS | Status: DC | PRN
  Administered 2018-03-06: 500 mL

## 2018-03-06 MED ORDER — SODIUM CHLORIDE 0.9% FLUSH
3.0000 mL | Freq: Two times a day (BID) | INTRAVENOUS | Status: DC
  Administered 2018-03-06: 3 mL via INTRAVENOUS

## 2018-03-06 MED ORDER — ONDANSETRON HCL 4 MG/2ML IJ SOLN: 4.0000 mg | Freq: Four times a day (QID) | INTRAMUSCULAR | Status: DC | PRN

## 2018-03-06 MED ORDER — NITROGLYCERIN 0.4 MG SL SUBL
0.4000 mg | SUBLINGUAL_TABLET | SUBLINGUAL | Status: DC | PRN
  Administered 2018-03-06 (×2): 0.4 mg via SUBLINGUAL
  Filled 2018-03-06: qty 1

## 2018-03-06 MED ORDER — SODIUM CHLORIDE 0.9% FLUSH: 3.0000 mL | INTRAVENOUS | Status: DC | PRN

## 2018-03-06 MED ORDER — LIDOCAINE HCL (PF) 1 % IJ SOLN
INTRAMUSCULAR | Status: DC | PRN
  Administered 2018-03-06: 2 mL

## 2018-03-06 MED ORDER — HEPARIN (PORCINE) IN NACL 1000-0.9 UT/500ML-% IV SOLN
INTRAVENOUS | Status: AC
  Filled 2018-03-06: qty 1000

## 2018-03-06 SURGICAL SUPPLY — 12 items
CATH INFINITI 5FR ANG PIGTAIL (CATHETERS) ×2 IMPLANT
CATH OPTITORQUE TIG 4.0 5F (CATHETERS) ×2 IMPLANT
DEVICE RAD COMP TR BAND LRG (VASCULAR PRODUCTS) ×2 IMPLANT
GLIDESHEATH SLEND A-KIT 6F 22G (SHEATH) ×2 IMPLANT
GUIDEWIRE INQWIRE 1.5J.035X260 (WIRE) ×1 IMPLANT
INQWIRE 1.5J .035X260CM (WIRE) ×2
KIT ENCORE 26 ADVANTAGE (KITS) IMPLANT
KIT HEART LEFT (KITS) ×2 IMPLANT
PACK CARDIAC CATHETERIZATION (CUSTOM PROCEDURE TRAY) ×2 IMPLANT
SYR MEDRAD MARK V 150ML (SYRINGE) ×2 IMPLANT
TRANSDUCER W/STOPCOCK (MISCELLANEOUS) ×2 IMPLANT
TUBING CIL FLEX 10 FLL-RA (TUBING) ×2 IMPLANT

## 2018-03-06 NOTE — ED Triage Notes (Signed)
Pt c/o left chest wall pain with numbness to left arm x 3 days " off and on" , pt states increased stress and heavy lifting at work

## 2018-03-06 NOTE — ED Notes (Signed)
ED Provider at bedside. 

## 2018-03-06 NOTE — ED Provider Notes (Signed)
[facility] EMERGENCY DEPARTMENT Provider Note   CSN: [id number] Arrival date & time: 03/06/18  0906     History   Chief Complaint Chief Complaint  Patient presents with  . Chest Pain    HPI Donald Buck is a 27 y.o. male.  Patient with onset of substernal chest pain yesterday at about 3 PM.  Associated with a little bit of lightheadedness and some shortness of breath but not severe no nausea no vomiting.  No leg swelling.  Patient denies any cocaine use.  Patient denies any history of similar pain.     Past Medical History:  Diagnosis Date  . Metacarpal bone fracture 12/14/2013   left small    There are no active problems to display for this patient.   Past Surgical History:  Procedure Laterality Date  . KNEE ARTHROSCOPY W/ ACL RECONSTRUCTION Left    and meniscus repair  . OPEN REDUCTION INTERNAL FIXATION (ORIF) METACARPAL Left 12/31/2013   Procedure: OPEN REDUCTION INTERNAL FIXATION (ORIF) LEFT SMALL FINGER METACARPAL FRACTURE;  Surgeon: Marlowe Shores, MD;  Location: [facility];  Service: Orthopedics;  Laterality: Left;        Home Medications    Prior to Admission medications   Not on File    Family History History reviewed. No pertinent family history.  Social History Social History   Tobacco Use  . Smoking status: Current Every Day Smoker    Years: 3.00    Types: Cigars  . Smokeless tobacco: Never Used  . Tobacco comment: 1-2 Black and Mild/day  Substance Use Topics  . Alcohol use: No  . Drug use: Yes    Types: Marijuana     Allergies   Patient has no known allergies.   Review of Systems Review of Systems  Constitutional: Negative for diaphoresis.  HENT: Negative for congestion.   Eyes: Negative for redness.  Respiratory: Positive for shortness of breath.   Cardiovascular: Positive for chest pain.  Gastrointestinal: Negative for abdominal pain, nausea and vomiting.  Genitourinary: Negative for dysuria.    Musculoskeletal: Negative for back pain.  Skin: Negative for rash.  Neurological: Positive for light-headedness. Negative for syncope.  Hematological: Does not bruise/bleed easily.  Psychiatric/Behavioral: Negative for confusion.     Physical Exam Updated Vital Signs BP 140/84   Pulse 95   Temp 97.9 F (36.6 C) (Oral)   Resp (!) 27   Ht 1.905 m (6\' 3" )   Wt 74.8 kg (165 lb)   SpO2 100%   BMI 20.62 kg/m   Physical Exam  Constitutional: He is oriented to person, place, and time. He appears well-developed and well-nourished. No distress.  HENT:  Head: Normocephalic and atraumatic.  Mouth/Throat: Oropharynx is clear and moist.  Eyes: Pupils are equal, round, and reactive to light. Conjunctivae and EOM are normal.  Neck: Neck supple.  Cardiovascular: Normal rate, regular rhythm and normal heart sounds.  Pulmonary/Chest: Effort normal and breath sounds normal. He exhibits no tenderness.  Abdominal: Soft. Bowel sounds are normal.  Musculoskeletal: Normal range of motion. He exhibits no edema.  Neurological: He is alert and oriented to person, place, and time. No cranial nerve deficit or sensory deficit. He exhibits normal muscle tone. Coordination normal.  Skin: Skin is warm.  Nursing note and vitals reviewed.    ED Treatments / Results  Labs (all labs ordered are listed, but only abnormal results are displayed) Labs Reviewed  CBC WITH DIFFERENTIAL/PLATELET  COMPREHENSIVE METABOLIC PANEL  TROPONIN I  PROTIME-INR  APTT  LIPID PANEL    EKG EKG Interpretation  Date/Time:  Wednesday March 06 2018 [id number] EDT Ventricular Rate:  62 PR Interval:    QRS Duration: 111 QT Interval:  428 QTC Calculation: 435 R Axis:   86 Text Interpretation:  Sinus rhythm Inferior infarct, acute (LCx) Anterolateral Q wave, probably normal for age Confirmed by Vanetta MuldersZackowski, Bo Rogue [phone]([id number]) on 03/06/2018 9:35:05 AM   Radiology Dg Chest 2 View  Result Date: 03/06/2018 CLINICAL DATA:  Chest  pain for 2 days EXAM: CHEST - 2 VIEW COMPARISON:  None. FINDINGS: No active infiltrate or effusion is seen. Mediastinal and hilar contours are unremarkable. The heart is within normal limits in size. No bony abnormality is seen. IMPRESSION: No active cardiopulmonary disease. Electronically Signed   By: Dwyane DeePaul  Barry M.D.   On: 03/06/2018 09:40    Procedures Procedures (including critical care time)  CRITICAL CARE Performed by: Vanetta MuldersZACKOWSKI,Braylen Staller Total critical care time: 30 minutes Critical care time was exclusive of separately billable procedures and treating other patients. Critical care was necessary to treat or prevent imminent or life-threatening deterioration. Critical care was time spent personally by me on the following activities: development of treatment plan with patient and/or surrogate as well as nursing, discussions with consultants, evaluation of patient's response to treatment, examination of patient, obtaining history from patient or surrogate, ordering and performing treatments and interventions, ordering and review of laboratory studies, ordering and review of radiographic studies, pulse oximetry and re-evaluation of patient's condition.   Medications Ordered in ED Medications  nitroGLYCERIN (NITROSTAT) SL tablet 0.4 mg (0.4 mg Sublingual Given 03/06/18 0945)  0.9 %  sodium chloride infusion ( Intravenous New Bag/Given 03/06/18 0944)  aspirin chewable tablet 324 mg (324 mg Oral Given 03/06/18 0946)  heparin injection 4,000 Units (4,000 Units Intravenous Given 03/06/18 0945)     Initial Impression / Assessment and Plan / ED Course  I have reviewed the triage vital signs and the nursing notes.  Pertinent labs & imaging results that were available during my care of the patient were reviewed by me and considered in my medical decision making (see chart for details).    Patient with onset of chest pain yesterday at 3:00 in the afternoon is been constant.  Denies any cocaine use.   EKG consistent with an inferior MI or perhaps pericarditis since there is some ST segment changes anterior laterally.  EKG [facility] cardi[facility].  Incision is been made to treat this as inferior MI will go to Cath Lab.  Although he agrees it could also be pericarditis.  Patient started on STEMI protocol patient given aspirin given nitro started on heparin chest x-ray is negative patient [facility].  [facility] did not have a truck available.   Final Clinical Impressions(s) / ED Diagnoses   Final diagnoses:  ST elevation myocardial infarction (STEMI), unspecified artery [facility](HCC)    ED Discharge Orders    None       Vanetta MuldersZackowski, Nickalos Petersen, MD 03/06/18 [phone]

## 2018-03-06 NOTE — H&P (Signed)
Cardiology Admission History and Physical:   Patient ID: Donald Buck; MRN: [id number]; DOB: 1991-02-25   Admission date: 03/06/2018  Primary Care Provider: Patient, No Pcp Per Primary Cardiologist: No primary care provider on file. New  Chief Complaint: Substernal chest pain  Patient Profile:   Donald Buck is a 27 y.o. male with a history of tobacco abuse and marijuana smoking but no other notable cardiac risk factors who presented to [facility] with complaints of substernal chest pain beginning at roughly 3 PM yesterday (03/05/2018).  History of Present Illness:   Donald Buck was in his usual state of health until yesterday, 03/05/2018, when he started developing significant 7-10 out of 10 substernal chest pain that was exacerbated by exertion.   Notably when he went to worse with work.  He did not notice any change in symptoms with lying down or sitting up.  No antecedent fevers chills cold or sweats.  He also adamantly denies any cocaine use.  When the symptoms continue to get worse today he decided to go to [facility] where he was evaluated and his EKG showed inferolateral ST elevations.  He was treated with nitroglycerin and aspirin along with 4000 units of IV heparin.  [facility] was [facility].  Upon arrival he was chest pain-free and his EKG showed resolution of elevations.   Past Medical History:  Diagnosis Date  . Metacarpal bone fracture 12/14/2013   left small    Past Surgical History:  Procedure Laterality Date  . KNEE ARTHROSCOPY W/ ACL RECONSTRUCTION Left    and meniscus repair  . OPEN REDUCTION INTERNAL FIXATION (ORIF) METACARPAL Left 12/31/2013   Procedure: OPEN REDUCTION INTERNAL FIXATION (ORIF) LEFT SMALL FINGER METACARPAL FRACTURE;  Surgeon: Marlowe ShoresMatthew A Weingold, MD;  Location: [facility];  Service: Orthopedics;  Laterality: Left;     Medications Prior to Admission: Prior to Admission  medications   Not on File     Allergies:   No Known Allergies  Social History:   Social History   Tobacco Use  . Smoking status: Current Every Day Smoker    Years: 3.00    Types: Cigars  . Smokeless tobacco: Never Used  . Tobacco comment: 1-2 Black and Mild/day  Substance Use Topics  . Alcohol use: No  . Drug use: Yes    Types: Marijuana   Social History   Social History Narrative  . Not on file     Family History: He is not aware of family history, but does not know of any premature coronary artery disease.  His grandmother died of lung cancer. The patient's family history is not on file.    ROS:  Please see the history of present illness.   No prior fevers chills cold sweats.  No coughing wheezing sneezing.  No PND, orthopnea or edema.  No rapid irregular heartbeats palpitations.  No syncope or near syncope.  No TIA or emesis fugax.  No melena, hematochezia hematuria. All other ROS reviewed and negative.     Physical Exam/Data:   Vitals:   03/06/18 0946 03/06/18 0949 03/06/18 0950 03/06/18 1000  BP:  131/78 131/78   Pulse: 95 89 76 74  Resp: (!) 27 18 16 18   Temp:      TempSrc:      SpO2: 100% 98% 100% 100%  Weight:      Height:       No intake or output data in the  24 hours ending 03/06/18 1034 Filed Weights   03/06/18 0913  Weight: 165 lb (74.8 kg)   Body mass index is 20.62 kg/m.  General:  Well nourished, well developed, in no acute distress upon arrival to the Cath Lab, however he was notably having chest pain while at [facility]. HEENT: normal Lymph: no adenopathy Neck: no JVD Endocrine:  No thryomegaly Vascular: No carotid bruits; FA pulses 2+ bilaterally without bruits  Cardiac:  normal S1, S2; RRR; no murmur with no obvious rub.  No gallops Lungs:  clear to auscultation bilaterally, no wheezing, rhonchi or rales; no chest wall tenderness  Abd: soft, nontender, no hepatomegaly  Ext: no peripheral edema Musculoskeletal:  No  deformities, BUE and BLE strength normal and equal Skin: warm and dry  Neuro:  CNs 2-12 intact, no focal abnormalities noted Psych:  Normal affect    EKG:  The ECG that was done at [facility] was personally reviewed and demonstrates sinus rhythm with inferior lateral ST elevation. Upon arrival to [facility] after 2 sublingual nitroglycerin in route, he is EKG no longer shows ST elevations and he is is chest pain-free.  Relevant CV Studies: None  Laboratory Data:  ChemistryNo results for input(s): NA, K, CL, CO2, GLUCOSE, BUN, CREATININE, CALCIUM, GFRNONAA, GFRAA, ANIONGAP in the last 168 hours.  No results for input(s): PROT, ALBUMIN, AST, ALT, ALKPHOS, BILITOT in the last 168 hours. HematologyNo results for input(s): WBC, RBC, HGB, HCT, MCV, MCH, MCHC, RDW, PLT in the last 168 hours. Cardiac Enzymes Recent Labs  Lab 03/06/18 0941  TROPONINI <0.03   No results for input(s): TROPIPOC in the last 168 hours.  BNPNo results for input(s): BNP, PROBNP in the last 168 hours.  DDimer No results for input(s): DDIMER in the last 168 hours.  Radiology/Studies:  Dg Chest 2 View  Result Date: 03/06/2018 CLINICAL DATA:  Chest pain for 2 days EXAM: CHEST - 2 VIEW COMPARISON:  None. FINDINGS: No active infiltrate or effusion is seen. Mediastinal and hilar contours are unremarkable. The heart is within normal limits in size. No bony abnormality is seen. IMPRESSION: No active cardiopulmonary disease. Electronically Signed   By: Dwyane Dee M.D.   On: 03/06/2018 09:40    Assessment and Plan:   1. Transient inferior ST elevation associated with chest pain.  Symptoms lasting for 1 day.  Resolved after 2 supple nitroglycerin.   Likely differential diagnosis includes pericarditis, coronary spasm versus true ST elevation MI.  He denies any change in symptoms lying down versus sitting up.  He did note symptoms exacerbated with exertion which would argue against pericarditis and coronary  spasm  Plan: At this point, I think the best course of action is to exclude ischemic CAD and allow for further evaluation pending results.   Severity of Illness: The appropriate patient status for this patient is INPATIENT. Inpatient status is judged to be reasonable and necessary in order to provide the required intensity of service to ensure the patient's safety. The patient's presenting symptoms, physical exam findings, and initial radiographic and laboratory data in the context of their chronic comorbidities is felt to place them at high risk for further clinical deterioration. Furthermore, it is not anticipated that the patient will be medically stable for discharge from the hospital within 2 midnights of admission. The following factors support the patient status of inpatient.   " The patient's presenting symptoms include severe chest pain made worse with exertion associate with dyspnea. " The  worrisome physical exam findings include none at present, but notably in distress in ER.. " The initial radiographic and laboratory data are worrisome because of EKG showing transient inferolateral ST elevations. " The chronic co-morbidities include tobacco abuse.   * I certify that at the point of admission it is my clinical judgment that the patient will require inpatient hospital care spanning beyond 2 midnights from the point of admission due to high intensity of service, high risk for further deterioration and high frequency of surveillance required.*It    For questions or updates, please contact [facility] Please consult [email or web address] for contact info under Cardiology/STEMI.    Signed, Bryan Lemma, MD  03/06/2018 10:34 AM

## 2018-03-06 NOTE — Progress Notes (Addendum)
Called to patient room at 1700.  Patient complains of a "funny feeling" in his left mid chest, 8/10, unable to to further specify the nature of the feeling.  Feeling is made worse with palpation.  Also complains of left arm numbness.  12 lead EKG done, Micah FlesherAngela Duke, PA, notified.  With walking and time, symptoms have lessened.  Patient reports no symptoms at this time (1830).

## 2018-03-06 NOTE — ED Notes (Signed)
EMS here for [facility]

## 2018-03-06 NOTE — ED Notes (Signed)
Family at bedside. 

## 2018-03-07 ENCOUNTER — Inpatient Hospital Stay (HOSPITAL_COMMUNITY): Payer: Self-pay

## 2018-03-07 DIAGNOSIS — R079 Chest pain, unspecified: Secondary | ICD-10-CM

## 2018-03-07 DIAGNOSIS — Z720 Tobacco use: Secondary | ICD-10-CM

## 2018-03-07 DIAGNOSIS — R072 Precordial pain: Secondary | ICD-10-CM

## 2018-03-07 DIAGNOSIS — R9431 Abnormal electrocardiogram [ECG] [EKG]: Secondary | ICD-10-CM

## 2018-03-07 DIAGNOSIS — I300 Acute nonspecific idiopathic pericarditis: Secondary | ICD-10-CM

## 2018-03-07 LAB — ECHOCARDIOGRAM COMPLETE
HEIGHTINCHES: 75 in
WEIGHTICAEL: 2416 [oz_av]

## 2018-03-07 LAB — BASIC METABOLIC PANEL
ANION GAP: 11 (ref 5–15)
BUN: 11 mg/dL (ref 6–20)
CO2: 19 mmol/L — AB (ref 22–32)
Calcium: 9.1 mg/dL (ref 8.9–10.3)
Chloride: 109 mmol/L (ref 98–111)
Creatinine, Ser: 0.97 mg/dL (ref 0.61–1.24)
Glucose, Bld: 78 mg/dL (ref 70–99)
POTASSIUM: 3.9 mmol/L (ref 3.5–5.1)
Sodium: 139 mmol/L (ref 135–145)

## 2018-03-07 LAB — TROPONIN I

## 2018-03-07 MED ORDER — COLCHICINE 0.6 MG PO TABS: 0.6000 mg | ORAL_TABLET | Freq: Two times a day (BID) | ORAL | 0 refills | Status: AC

## 2018-03-07 MED ORDER — IBUPROFEN 200 MG PO TABS
400.0000 mg | ORAL_TABLET | Freq: Three times a day (TID) | ORAL | Status: DC
  Administered 2018-03-07: 11:00:00 400 mg via ORAL
  Filled 2018-03-07: qty 2

## 2018-03-07 MED ORDER — IBUPROFEN 400 MG PO TABS: 400.0000 mg | ORAL_TABLET | Freq: Three times a day (TID) | ORAL | 0 refills | Status: DC

## 2018-03-07 MED ORDER — COLCHICINE 0.6 MG PO TABS
0.6000 mg | ORAL_TABLET | Freq: Two times a day (BID) | ORAL | Status: DC
  Administered 2018-03-07: 0.6 mg via ORAL
  Filled 2018-03-07: qty 1

## 2018-03-07 MED ORDER — NITROGLYCERIN 0.4 MG SL SUBL: 0.4000 mg | SUBLINGUAL_TABLET | SUBLINGUAL | 0 refills | Status: AC | PRN

## 2018-03-07 NOTE — Progress Notes (Addendum)
Progress Note  Patient Name: Donald Buck Date of Encounter: 03/07/2018 Primary Cardiologist: New- Dr. Bryan Lemma, MD  Subjective   Episode of left mid chest 8/10 "funny feeling" in chest yesterday evening.  Worse with palpation.  Resolved with walking over time. No symptoms this AM. Cath site unremarkable   Inpatient Medications    Scheduled Meds: . diltiazem  60 mg Oral Q12H  . nicotine  7 mg Transdermal Daily  . pneumococcal 23 valent vaccine  0.5 mL Intramuscular Tomorrow-1000  . sodium chloride flush  3 mL Intravenous Q12H   Continuous Infusions: . sodium chloride 20 mL/hr at 03/06/18 0944  . sodium chloride     PRN Meds: sodium chloride, acetaminophen, morphine injection, nitroGLYCERIN, ondansetron (ZOFRAN) IV, sodium chloride flush   Vital Signs    Vitals:   03/06/18 2000 03/06/18 2245 03/07/18 0316 03/07/18 0319  BP:  125/72  136/63  Pulse:    60  Resp: 16   (!) 21  Temp:    97.6 F (36.4 C)  TempSrc:    Oral  SpO2:    98%  Weight:   151 lb (68.5 kg)   Height:        Intake/Output Summary (Last 24 hours) at 03/07/2018 0718 Last data filed at 03/07/2018 0600 Gross per 24 hour  Intake 1729.71 ml  Output 450 ml  Net 1279.71 ml   Filed Weights   03/06/18 0913 03/07/18 0316  Weight: 165 lb (74.8 kg) 151 lb (68.5 kg)    Physical Exam   General: Well developed, well nourished, NAD Skin: Warm, dry, intact  Head: Normocephalic, atraumatic, clear, moist mucus membranes. Neck: Negative for carotid bruits. No JVD Lungs: Clear to ausculation bilaterally. No wheezes, rales, or rhonchi. Breathing is unlabored. Cardiovascular: RRR with S1 S2. No murmurs, rubs, gallops, or LV heave appreciated. Abdomen: Soft, non-tender, non-distended with normoactive bowel sounds. No obvious abdominal masses. MSK: Strength and tone appear normal for age. 5/5 in all extremities Extremities: No edema. No clubbing or cyanosis. DP/PT pulses 2+ bilaterally Neuro: Alert and  oriented. No focal deficits. No facial asymmetry. MAE spontaneously. Psych: Responds to questions appropriately with normal affect.    Labs    Chemistry Recent Labs  Lab 03/06/18 0941 03/06/18 1055  NA 138 141  K 3.4* 3.0*  CL 107 105  CO2 24  --   GLUCOSE 86 113*  BUN 13 12  CREATININE 0.93 0.90  CALCIUM 9.0  --   PROT 7.7  --   ALBUMIN 4.3  --   AST 19  --   ALT 14  --   ALKPHOS 49  --   BILITOT 1.6*  --   GFRNONAA >60  --   GFRAA >60  --   ANIONGAP 7  --      Hematology Recent Labs  Lab 03/06/18 0941 03/06/18 1055  WBC 6.9  --   RBC 4.58  --   HGB 15.6 14.6  HCT 42.2 43.0  MCV 92.1  --   MCH 34.1*  --   MCHC 37.0*  --   RDW 11.2*  --   PLT 242  --     Cardiac Enzymes Recent Labs  Lab 03/06/18 0941 03/06/18 1350 03/06/18 1916 03/07/18 0202  TROPONINI <0.03 <0.03 <0.03 <0.03   No results for input(s): TROPIPOC in the last 168 hours.   BNPNo results for input(s): BNP, PROBNP in the last 168 hours.   DDimer No results for input(s): DDIMER in the  last 168 hours.   Radiology    Dg Chest 2 View  Result Date: 03/06/2018 CLINICAL DATA:  Chest pain for 2 days EXAM: CHEST - 2 VIEW COMPARISON:  None. FINDINGS: No active infiltrate or effusion is seen. Mediastinal and hilar contours are unremarkable. The heart is within normal limits in size. No bony abnormality is seen. IMPRESSION: No active cardiopulmonary disease. Electronically Signed   By: Dwyane DeePaul  Barry M.D.   On: 03/06/2018 09:40   Telemetry    03/07/18 NSR HR 62 - Personally Reviewed  ECG    03/06/2018 NSR with resolving ST elevation in inferior leads- Personally Reviewed  Cardiac Studies   Cardiac cath 03/06/2018:   There is hyperdynamic left ventricular systolic function. The left ventricular ejection fraction is greater than 65% by visual estimate.  LV end diastolic pressure is normal.  Angiographically Normal Coronary Arteries.   Angiographically normal coronary arteries with no  evidence of lesion or spasm to explain transient ST elevations. Normal LVEF with hyperdynamic function and normal EDP.  Plan: We will admit to telemetry.  Follow cardiac enzymes.    Check 2D echocardiogram to exclude pericardial effusion/pericarditis.  Consider treatment for possible coronary spasm given transient ST elevations.  Will start on low-dose diltiazem.  No indication for antiplatelet therapy at this time.   Patient Profile     27 y.o. male with a history of tobacco abuse and marijuana smoking but no other notable cardiac risk factors who [facility] with complaints of substernal chest pain beginning at roughly 3 PM yesterday (03/05/2018).  Assessment & Plan    1.  Transient inferior ST elevation with associated chest pain: -Pt initially presented to [facility] with substernal chest pain exacerbated with exertion.  Due to progressive symptoms throughout the day patient went for further evaluation.  EKG revealed ST elevations in inferior lateral leads. Patient was [facility] -Cardiac cath on 03/06/2018 revealed normal coronaries and a left ventricular ejection fraction greater than 65%. There was no evidence of lesion or spasm to explain transient ST elevations. -Plan is to check an echocardiogram today, 03/07/18 to exclude pericardial effusion/pericarditis -Consider treatment for possible coronary spasm given transient ST elevations -Low-dose diltiazem 60 mg twice daily was initiated>>>transitent bradycardia overnight with lowest HR noted at 40bpm. Will discuss with MD whether to continue. Will set HR parameters will inpatient.  -No antiplatelet therapy needed -Echocardiogram pending this AM   2.  Tobacco use: -Smoking cessation strongly encouraged -Nicotine patch in place   Signed, Georgie ChardJill McDaniel NP-C [facility] Pager: [phone] 03/07/2018, 7:18 AM     For questions or updates, please contact   Please  consult [email or web address] for contact info under Cardiology/STEMI.  The patient was seen, examined and discussed with Georgie ChardJill McDaniel, NP  and I agree with the above.   Chest pain resolved, diffuse concave ST elevations that have improved since yesterday. Troponin negative x 3, normal coronaries. Started on colchicine 0.6 mg po BID x 3 months and ibuprofen 400 mg po TID x 1 week. Awaiting echocardiogram if normal we will discharge home and plan for a follow up in 2-3 weeks. I will discontinue cardizem as he became very bradycardic.  Tobias AlexanderKatarina Cullin Dishman, MD 03/07/2018

## 2018-03-07 NOTE — Care Management Note (Addendum)
Case Management Note  Patient Details  Name: Donald Buck MRN: [id number] Date of Birth: 1991/03/24  Subjective/Objective:  From home, s/p cath , which shows Angiographically normal coronary arteries with no evidence of lesion or spasm to explain transient ST elevations. Normal LVEF with hyperdynamic function and normal EDP. No antiplatelet  Plans. Patient with no PCP, he will call [facility] clinic to schedule a hospital follow up apt, and he states he does not need ast with medications at discharge.                    Action/Plan: No antiplatelet plans, dc when ready.  Expected Discharge Date:                  Expected Discharge Plan:  Home/Self Care  In-House Referral:     Discharge planning Services  CM Consult  Post Acute Care Choice:    Choice offered to:     DME Arranged:    DME Agency:     HH Arranged:    HH Agency:     Status of Service:  Completed, signed off  If discussed at [employer] Length of Stay Meetings, dates discussed:    Additional Comments:  Leone Havenaylor, Tyrese Capriotti Clinton, RN 03/07/2018, 8:08 AM

## 2018-03-07 NOTE — Progress Notes (Signed)
*  PRELIMINARY RESULTS* Echocardiogram 2D Echocardiogram has been performed.  Stacey DrainWhite, Mervyn Pflaum J 03/07/2018, 11:50 AM

## 2018-03-07 NOTE — Discharge Summary (Signed)
Discharge Summary    Patient ID: Donald Buck,  MRN: [id number], DOB/AGE: [birth year] 27 y.o.  Admit date: 03/06/2018 Discharge date: 03/07/2018  Primary Care Provider: Patient, No Pcp Per Primary Cardiologist: Bryan Lemma, MD  Discharge Diagnoses    Active Problems:   ST elevation MI (STEMI) [facility])   ST elevation   Tobacco abuse  Allergies No Known Allergies  Diagnostic Studies/Procedures    Cardiac catheterization 03/06/2018:  There is hyperdynamic left ventricular systolic function. The left ventricular ejection fraction is greater than 65% by visual estimate.  LV end diastolic pressure is normal.  Angiographically Normal Coronary Arteries.   Angiographically normal coronary arteries with no evidence of lesion or spasm to explain transient ST elevations. Normal LVEF with hyperdynamic function and normal EDP.  Plan: We will admit to telemetry.  Follow cardiac enzymes.    Check 2D echocardiogram to exclude pericardial effusion/pericarditis.  Consider treatment for possible coronary spasm given transient ST elevations.  Will start on low-dose diltiazem.  No indication for antiplatelet therapy at this time.   History of Present Illness     Donald Buck is a 27 y.o. male with a history of tobacco abuse and marijuana smoking but no other notable cardiac risk factors who presented to Med [facility] with complaints of substernal chest pain beginning at roughly 3 PM on 03/05/2018.  Donald Buck was in his usual state of health until yesterday, 03/05/2018, when he started developing significant 7/10 substernal chest pain that was exacerbated by exertion which became notably worse when he went to work. He did not notice any change in symptoms with lying down or sitting up.  No antecedent fevers chills cold or sweats. He also adamantly denies any cocaine use. When the symptoms continued to worsen,  he decided to go to Med [facility] where he was evaluated and his EKG  showed inferolateral ST elevations. He was treated with nitroglycerin and aspirin along with 4000 units of IV heparin.  He was then transported to [facility] for urgent catheterization. Upon arrival he was chest pain-free and his EKG showed resolution of elevations.  Hospital Course   He was taken to the cardiac Cath Lab on 03/06/2018 which revealed angiographically normal coronary arteries with no evidence of lesion or spasm to explain the transient ST elevations.  He had a normal LVEF with hyperdynamic function and normal EDP.  Recommendations were to admit to telemetry and follow cardiac enzymes which have been negative.  An echocardiogram was ordered to exclude pericardial effusion/pericarditis. Reviewed by MD and felt to be normal.  He was started on diltiazem 60 mg however, this was stopped secondary to nocturnal bradycardia with a heart rate in the low 40s.  He had one recurrence of chest discomfort however resolved with time.  Follow-up EKG with resolving ST elevations in inferior leads.  Noted to have concave ST elevations also improved from yesterday.  Additionally, he was started on ibuprofen 400 mg 3 times daily for one week, as well as colchicine 0.6 mg twice daily for 3 months (both initiated 03/07/18).  Patient has been seen and examined by Dr. Delton See who feels that he is stable and ready for discharge pending echocardiogram results>>>Dr. Delton See reviewed study and feels that the echo is normal and he is now ready for discharge.  We will set him up for follow-up in approximately 2 to 3 weeks to assess his pain.  Cath site unremarkable.  Consultants: None   Discharge Vitals Blood  pressure 114/77, pulse (!) 53, temperature 98.3 F (36.8 C), resp. rate 14, height 6\' 3"  (1.905 m), weight 151 lb (68.5 kg), SpO2 100 %.  Filed Weights   03/06/18 0913 03/07/18 0316  Weight: 165 lb (74.8 kg) 151 lb (68.5 kg)   Labs & Radiologic Studies    CBC Recent Labs    03/06/18 0941  03/06/18 1055  WBC 6.9  --   NEUTROABS 5.3  --   HGB 15.6 14.6  HCT 42.2 43.0  MCV 92.1  --   PLT 242  --    Basic Metabolic Panel Recent Labs    16/05/9606/10/19 0941 03/06/18 1055 03/07/18 0202  NA 138 141 139  K 3.4* 3.0* 3.9  CL 107 105 109  CO2 24  --  19*  GLUCOSE 86 113* 78  BUN 13 12 11   CREATININE 0.93 0.90 0.97  CALCIUM 9.0  --  9.1   Liver Function Tests Recent Labs    03/06/18 0941  AST 19  ALT 14  ALKPHOS 49  BILITOT 1.6*  PROT 7.7  ALBUMIN 4.3   Cardiac Enzymes Recent Labs    03/06/18 1350 03/06/18 1916 03/07/18 0202  TROPONINI <0.03 <0.03 <0.03   Fasting Lipid Panel Recent Labs    03/06/18 0941  CHOL 187  HDL 51  LDLCALC 127*  TRIG 47  CHOLHDL 3.7  _____________  Dg Chest 2 View  Result Date: 03/06/2018 CLINICAL DATA:  Chest pain for 2 days EXAM: CHEST - 2 VIEW COMPARISON:  None. FINDINGS: No active infiltrate or effusion is seen. Mediastinal and hilar contours are unremarkable. The heart is within normal limits in size. No bony abnormality is seen. IMPRESSION: No active cardiopulmonary disease. Electronically Signed   By: Dwyane DeePaul  Barry M.D.   On: 03/06/2018 09:40   Disposition   Pt is being discharged home today in good condition.  Follow-up Plans & Appointments    Follow-up Information    Azalee CourseMeng, Hao, [place] Follow up on 03/28/2018.   Specialties:  Cardiology, Radiology Why:  Your appointment will be on 03/28/2018 at 8 AM.  Please arrive to your appointment at 7:45 AM. Contact information: [address] [place] [address] [phone]          Discharge Instructions    Call MD for:  difficulty breathing, headache or visual disturbances   Complete by:  As directed    Call MD for:  persistant dizziness or light-headedness   Complete by:  As directed    Call MD for:  persistant nausea and vomiting   Complete by:  As directed    Call MD for:  redness, tenderness, or signs of infection (pain, swelling, redness, odor or  green/yellow discharge around incision site)   Complete by:  As directed    Call MD for:  temperature >100.4   Complete by:  As directed    Diet - low sodium heart healthy   Complete by:  As directed    Discharge instructions   Complete by:  As directed    No driving for 3 days. No lifting over 5 lbs for 1 week. No sexual activity for 1 week. You may return to work on 03/11/18. Keep procedure site clean & dry. If you notice increased pain, swelling, bleeding or pus, call/return!  You may shower, but no soaking baths/hot tubs/pools for 1 week.   Increase activity slowly   Complete by:  As directed      Discharge Medications   Allergies as  of 03/07/2018   No Known Allergies     Medication List    TAKE these medications   colchicine 0.6 MG tablet Take 1 tablet (0.6 mg total) by mouth 2 (two) times daily.   ibuprofen 400 MG tablet Commonly known as:  ADVIL,MOTRIN Take 1 tablet (400 mg total) by mouth 3 (three) times daily.   nitroGLYCERIN 0.4 MG SL tablet Commonly known as:  NITROSTAT Place 1 tablet (0.4 mg total) under the tongue every 5 (five) minutes as needed for chest pain.        Acute coronary syndrome (MI, NSTEMI, STEMI, etc) this admission?: No.    Outstanding Labs/Studies   None   Duration of Discharge Encounter   Greater than 30 minutes including physician time.  Signed, Georgie Chard, NP 03/07/2018, 12:46 PM

## 2018-03-11 LAB — DRUG PROFILE, UR, 9 DRUGS (LABCORP)
Amphetamines, Urine: NEGATIVE ng/mL
BARBITURATE, UR: NEGATIVE ng/mL
Benzodiazepine Quant, Ur: NEGATIVE ng/mL
Cannabinoid Quant, Ur: POSITIVE ng/mL — AB
Cocaine (Metab.): NEGATIVE ng/mL
METHADONE SCREEN, URINE: NEGATIVE ng/mL
Opiate Quant, Ur: NEGATIVE ng/mL
PROPOXYPHENE, URINE: NEGATIVE ng/mL
Phencyclidine, Ur: NEGATIVE ng/mL

## 2018-03-17 ENCOUNTER — Emergency Department (HOSPITAL_BASED_OUTPATIENT_CLINIC_OR_DEPARTMENT_OTHER)
Admission: EM | Admit: 2018-03-17 | Discharge: 2018-03-17 | Disposition: A | Discharge status: Home / Self Care | Payer: Self-pay | Attending: Emergency Medicine | Admitting: Emergency Medicine

## 2018-03-17 ENCOUNTER — Other Ambulatory Visit: Payer: Self-pay

## 2018-03-17 ENCOUNTER — Encounter (HOSPITAL_BASED_OUTPATIENT_CLINIC_OR_DEPARTMENT_OTHER): Payer: Self-pay | Admitting: Emergency Medicine

## 2018-03-17 ENCOUNTER — Emergency Department (HOSPITAL_BASED_OUTPATIENT_CLINIC_OR_DEPARTMENT_OTHER): Payer: Self-pay

## 2018-03-17 DIAGNOSIS — R0789 Other chest pain: Secondary | ICD-10-CM | POA: Insufficient documentation

## 2018-03-17 DIAGNOSIS — I252 Old myocardial infarction: Secondary | ICD-10-CM | POA: Insufficient documentation

## 2018-03-17 DIAGNOSIS — F17290 Nicotine dependence, other tobacco product, uncomplicated: Secondary | ICD-10-CM | POA: Insufficient documentation

## 2018-03-17 DIAGNOSIS — Z79899 Other long term (current) drug therapy: Secondary | ICD-10-CM | POA: Insufficient documentation

## 2018-03-17 HISTORY — DX: Disease of pericardium, unspecified: I31.9

## 2018-03-17 LAB — CBC
HEMATOCRIT: 40.3 % (ref 39.0–52.0)
Hemoglobin: 14.9 g/dL (ref 13.0–17.0)
MCH: 33.9 pg (ref 26.0–34.0)
MCHC: 37 g/dL — ABNORMAL HIGH (ref 30.0–36.0)
MCV: 91.8 fL (ref 78.0–100.0)
PLATELETS: 292 10*3/uL (ref 150–400)
RBC: 4.39 MIL/uL (ref 4.22–5.81)
RDW: 11.1 % — ABNORMAL LOW (ref 11.5–15.5)
WBC: 5.6 10*3/uL (ref 4.0–10.5)

## 2018-03-17 LAB — BASIC METABOLIC PANEL
Anion gap: 10 (ref 5–15)
BUN: 11 mg/dL (ref 6–20)
CHLORIDE: 106 mmol/L (ref 98–111)
CO2: 22 mmol/L (ref 22–32)
CREATININE: 0.9 mg/dL (ref 0.61–1.24)
Calcium: 9.3 mg/dL (ref 8.9–10.3)
GFR calc Af Amer: >60 mL/min (ref >60)
GFR calc non Af Amer: >60 mL/min (ref >60)
Glucose, Bld: 92 mg/dL (ref 70–99)
POTASSIUM: 3.3 mmol/L — AB (ref 3.5–5.1)
Sodium: 138 mmol/L (ref 135–145)

## 2018-03-17 LAB — TROPONIN I
Troponin I: <0.03 ng/mL (ref <0.03)
Troponin I: <0.03 ng/mL (ref <0.03)

## 2018-03-17 MED ORDER — ASPIRIN 81 MG PO CHEW
324.0000 mg | CHEWABLE_TABLET | Freq: Once | ORAL | Status: AC
Start: 2018-03-17 — End: 2018-03-17
  Administered 2018-03-17: 324 mg via ORAL
  Filled 2018-03-17: qty 4

## 2018-03-17 MED ORDER — NITROGLYCERIN 0.4 MG SL SUBL: 0.4000 mg | SUBLINGUAL_TABLET | SUBLINGUAL | Status: DC | PRN

## 2018-03-17 NOTE — ED Notes (Signed)
Pt d/c home with family. Ambulatory to d/c window with steady gait

## 2018-03-17 NOTE — ED Triage Notes (Addendum)
L side chest discomfort since yesterday. Hx of STEMI, took 1 NTG with some relief.

## 2018-03-17 NOTE — ED Notes (Signed)
Pt states he did not have an MI but was diagnosed with pericarditis.

## 2018-03-17 NOTE — ED Provider Notes (Signed)
[facility] EMERGENCY DEPARTMENT Provider Note   CSN: [id number] Arrival date & time: 03/17/18  1126     History   Chief Complaint Chief Complaint  Patient presents with  . Chest Pain    HPI Donald Buck is a 27 y.o. male.  27 year old male with past medical history including recent heart catheterization for ST elevation and current treatment for pericarditis who presents with chest pain.  The patient presented here on 7/10 with chest pain. EKG showed ST elevation, he was made STEMI alert and had heart cath that was clean. ECHO normal. Started on colchicine and ibuprofen by cardiology.  He has been doing well since discharge home and continues to take the colchicine.  Overnight around 3 AM, he woke up with left-sided chest discomfort similar to but less severe than his previous chest pain.  It resolved after a few minutes after taking 1 dose of nitroglycerin.  He was chest pain-free until 9:30 PM when he was on the way to church and had a similar episode of chest pain, again resolved quickly after a dose of nitroglycerin.  The pain has not returned since then and he is currently chest pain-free.  The pain sometimes radiates to his left arm but he denies any associated shortness of breath, nausea, vomiting, diaphoresis, or lightheadedness.  No recent fevers or cough/cold symptoms.  He otherwise feels well.  He denies any drug use.  The history is provided by the patient.  Chest Pain      Past Medical History:  Diagnosis Date  . Arthritis    "left knee" (03/06/2018)  . Metacarpal bone fracture 12/14/2013   left small  . Pericarditis   . STEMI (ST elevation myocardial infarction) (HCC) 03/06/2018   Transient inferior ST elevation associated with chest pain/note 03/06/2018    Patient Active Problem List   Diagnosis Date Noted  . Tobacco abuse 03/07/2018  . ST elevation MI (STEMI) (HCC) 03/06/2018  . ST elevation 03/06/2018    Past Surgical History:  Procedure  Laterality Date  . FRACTURE SURGERY    . KNEE ARTHROSCOPY W/ ACL RECONSTRUCTION Left 2012   and meniscus repair  . LEFT HEART CATH AND CORONARY ANGIOGRAPHY N/A 03/06/2018   Procedure: LEFT HEART CATH AND CORONARY ANGIOGRAPHY;  Surgeon: Marykay Lex, MD;  Location[facility] INVASIVE CV LAB;  Service: Cardiovascular;  Laterality: N/A;  . OPEN REDUCTION INTERNAL FIXATION (ORIF) METACARPAL Left 12/31/2013   Procedure: OPEN REDUCTION INTERNAL FIXATION (ORIF) LEFT SMALL FINGER METACARPAL FRACTURE;  Surgeon: Marlowe Shores, MD;  Location: [facility];  Service: Orthopedics;  Laterality: Left;        Home Medications    Prior to Admission medications   Medication Sig Start Date End Date Taking? Authorizing Provider  colchicine 0.6 MG tablet Take 1 tablet (0.6 mg total) by mouth 2 (two) times daily. 03/07/18   Filbert Schilder, NP  ibuprofen (ADVIL,MOTRIN) 400 MG tablet Take 1 tablet (400 mg total) by mouth 3 (three) times daily. 03/07/18   Filbert Schilder, NP  nitroGLYCERIN (NITROSTAT) 0.4 MG SL tablet Place 1 tablet (0.4 mg total) under the tongue every 5 (five) minutes as needed for chest pain. 03/07/18   Filbert Schilder, NP    Family History No family history on file.  Social History Social History   Tobacco Use  . Smoking status: Current Every Day Smoker    Years: 3.00    Types: Cigars  . Smokeless tobacco: Never Used  .  Tobacco comment: 1-2 Black and Mild/day  Substance Use Topics  . Alcohol use: Not Currently  . Drug use: Yes    Types: Marijuana    Comment: 03/06/2018 "daily"     Allergies   Patient has no known allergies.   Review of Systems Review of Systems  Cardiovascular: Positive for chest pain.   All other systems reviewed and are negative except that which was mentioned in HPI   Physical Exam Updated Vital Signs BP 139/85 (BP Location: Right Arm)   Pulse 62   Temp 98.2 F (36.8 C) (Oral)   Resp 19   Ht 6\' 3"  (1.905 m)   Wt 72.6 kg (160 lb)    SpO2 100%   BMI 20.00 kg/m   Physical Exam  Constitutional: He is oriented to person, place, and time. He appears well-developed and well-nourished. No distress.  HENT:  Head: Normocephalic and atraumatic.  Moist mucous membranes  Eyes: Conjunctivae are normal.  Neck: Neck supple.  Cardiovascular: Normal rate, regular rhythm and normal heart sounds. Exam reveals no friction rub.  No murmur heard. Pulmonary/Chest: Effort normal and breath sounds normal.  Abdominal: Soft. Bowel sounds are normal. He exhibits no distension. There is no tenderness.  Musculoskeletal: He exhibits no edema.  Neurological: He is alert and oriented to person, place, and time.  Fluent speech  Skin: Skin is warm and dry.  Psychiatric: He has a normal mood and affect. Judgment normal.  Nursing note and vitals reviewed.    ED Treatments / Results  Labs (all labs ordered are listed, but only abnormal results are displayed) Labs Reviewed  BASIC METABOLIC PANEL - Abnormal; Notable for the following components:      Result Value   Potassium 3.3 (*)    All other components within normal limits  CBC - Abnormal; Notable for the following components:   MCHC 37.0 (*)    RDW 11.1 (*)    All other components within normal limits  TROPONIN I  TROPONIN I    EKG EKG Interpretation  Date/Time:  Sunday March 17 2018 11:32:45 EDT Ventricular Rate:  63 PR Interval:    QRS Duration: 104 QT Interval:  422 QTC Calculation: 432 R Axis:   81 Text Interpretation:  Sinus rhythm Probable left ventricular hypertrophy Anterior ST elevation, probably due to LVH ST elevation in inferior leads has been present on previous EKG, no reciprocal changes, similar to previous recent EKG7/10/19 Confirmed by Frederick PeersLittle, Rachel [id number]) on 03/17/2018 11:50:28 AM Also confirmed by Frederick PeersLittle, Rachel [id number]), editor Sheppard EvensSimpson, Miranda ([id number])  on 03/17/2018 12:58:25 PM   Radiology Dg Chest 2 View  Result Date: 03/17/2018 CLINICAL DATA:  Chest  pain since 330 this morning. History of smoking. EXAM: CHEST - 2 VIEW COMPARISON:  Chest x-ray 03/06/2018 FINDINGS: The cardiac silhouette, mediastinal and hilar contours are within normal limits and stable. The lungs are clear. No pleural effusion. No pneumothorax. The bony thorax is intact. IMPRESSION: No acute cardiopulmonary findings. Electronically Signed   By: Rudie MeyerP.  Gallerani M.D.   On: 03/17/2018 12:38    Procedures Procedures (including critical care time)  Medications Ordered in ED Medications  aspirin chewable tablet 324 mg (324 mg Oral Given 03/17/18 1220)     Initial Impression / Assessment and Plan / ED Course  I have reviewed the triage vital signs and the nursing notes.  Pertinent labs & imaging results that were available during my care of the patient were reviewed by me and considered in my medical decision making (  see chart for details).     Patient was well-appearing and chest pain-free on arrival.  His EKG does show concave ST elevation but no reciprocal changes and it appears similar to previous EKG in the setting of normal heart catheterization.  Chest x-ray normal.  His lab work is reassuring including negative serial troponins.  Given his recent normal heart catheterization and no ongoing pain with normal vital signs, I feel he is safe for discharge with continued follow-up with the cardiology team.  I have instructed him to continue colchicine.  Reviewed return precautions and he voiced understanding. Final Clinical Impressions(s) / ED Diagnoses   Final diagnoses:  Atypical chest pain    ED Discharge Orders    None       Little, Ambrose Finland, MD 03/17/18 1530

## 2018-03-28 ENCOUNTER — Encounter: Payer: Self-pay | Admitting: Physician Assistant

## 2018-03-28 ENCOUNTER — Ambulatory Visit (INDEPENDENT_AMBULATORY_CARE_PROVIDER_SITE_OTHER): Payer: Self-pay | Admitting: Physician Assistant

## 2018-03-28 VITALS — BP 132/81 | HR 54 | Ht 73.0 in | Wt 159.0 lb

## 2018-03-28 DIAGNOSIS — R079 Chest pain, unspecified: Secondary | ICD-10-CM

## 2018-03-28 NOTE — Patient Instructions (Signed)
Medication Instructions:  Complete Colchicine STOP on 06/07/2018  Labwork: None   Testing/Procedures: None   Follow-Up: Your physician recommends that you schedule a follow-up appointment in: 6-9 months with Dr Herbie BaltimoreHarding.  Any Other Special Instructions Will Be Listed Below (If Applicable). If you need a refill on your cardiac medications before your next appointment, please call your pharmacy.

## 2018-03-28 NOTE — Progress Notes (Signed)
Cardiology Office Note    Date:  03/28/2018   ID:  Donald Buck, DOB 11-Nov-1990, MRN [id number]  PCP:  Patient, No Pcp Per  Cardiologist:  Dr. Herbie Baltimore  Chief Complaint  Patient presents with  . Follow-up    recent cath for chest pain, normal coronaries    History of Present Illness:  Donald Buck is a 27 y.o. male with PMH of tobacco abuse and marijuana who recently presented to med [facility] with complaint of substernal chest pain.  Initial EKG showed inferolateral ST elevation.  He was treated with nitroglycerin and IV heparin, after which he was transported to [facility] for cardiac catheterization.  Upon arrival, he was chest pain-free and EKG showed resolution of the ST elevation.  He underwent urgent cardiac catheterization on 03/06/2018 which revealed angiographically normal coronaries with no evidence of lesion or spasm to explain the transient ST elevations.  Echocardiogram was normal without pericardial effusion.  He was initially started on diltiazem 60 mg, this was stopped later secondary to nocturnal bradycardia with heart rate down to 40s.  He was treated with ibuprofen 400 mg 3 times a day for week and colchicine 0.6 mg twice daily for 61-month.  Since his discharge, he was seen back in the ED again on 03/17/2018 with a another episode of chest pain, EKG showed a concave ST elevation however no reciprocal changes and appears to be similar to the previous EKG.  No further work-up was recommended and he was discharged from the emergency room.  He presents today for cardiology office visit.  He has been doing well since recent ED visit.  He was able to play 3-4 basketball games last weekend without any exertional chest pain or shortness of breath.  He denies any lower extremity edema, orthopnea or PND.  He has completed a course of ibuprofen 4 weeks.  He will finish the 53-month course of colchicine and then stop after that.  Overall I think he will do quite well.  Given  his young age and angiographically normal coronaries, he does not need aspirin.  He can follow-up with Dr. Herbie Baltimore in 6 to 9 months, if stable at that time, he may follow-up with cardiology on an as-needed basis after that.    Past Medical History:  Diagnosis Date  . Arthritis    "left knee" (03/06/2018)  . Metacarpal bone fracture 12/14/2013   left small  . Pericarditis   . STEMI (ST elevation myocardial infarction) (HCC) 03/06/2018   Transient inferior ST elevation associated with chest pain/note 03/06/2018    Past Surgical History:  Procedure Laterality Date  . FRACTURE SURGERY    . KNEE ARTHROSCOPY W/ ACL RECONSTRUCTION Left 2012   and meniscus repair  . LEFT HEART CATH AND CORONARY ANGIOGRAPHY N/A 03/06/2018   Procedure: LEFT HEART CATH AND CORONARY ANGIOGRAPHY;  Surgeon: Marykay Lex, MD;  Location: [facility] INVASIVE CV LAB;  Service: Cardiovascular;  Laterality: N/A;  . OPEN REDUCTION INTERNAL FIXATION (ORIF) METACARPAL Left 12/31/2013   Procedure: OPEN REDUCTION INTERNAL FIXATION (ORIF) LEFT SMALL FINGER METACARPAL FRACTURE;  Surgeon: Marlowe Shores, MD;  Location: [facility];  Service: Orthopedics;  Laterality: Left;    Current Medications: Outpatient Medications Prior to Visit  Medication Sig Dispense Refill  . colchicine 0.6 MG tablet Take 1 tablet (0.6 mg total) by mouth 2 (two) times daily. 180 tablet 0  . nitroGLYCERIN (NITROSTAT) 0.4 MG SL tablet Place 1 tablet (0.4 mg total) under the tongue  every 5 (five) minutes as needed for chest pain. 25 tablet 0  . ibuprofen (ADVIL,MOTRIN) 400 MG tablet Take 1 tablet (400 mg total) by mouth 3 (three) times daily. (Patient not taking: Reported on 03/28/2018) 21 tablet 0   No facility-administered medications prior to visit.      Allergies:   Patient has no known allergies.   Social History   Socioeconomic History  . Marital status: Single    Spouse name: Not on file  . Number of children: Not on file  . Years  of education: Not on file  . Highest education level: Not on file  Occupational History  . Not on file  Social Needs  . Financial resource strain: Not on file  . Food insecurity:    Worry: Not on file    Inability: Not on file  . Transportation needs:    Medical: Not on file    Non-medical: Not on file  Tobacco Use  . Smoking status: Current Every Day Smoker    Years: 3.00    Types: Cigars  . Smokeless tobacco: Never Used  . Tobacco comment: 1-2 Black and Mild/day  Substance and Sexual Activity  . Alcohol use: Not Currently  . Drug use: Yes    Types: Marijuana    Comment: 03/06/2018 "daily"  . Sexual activity: Not on file  Lifestyle  . Physical activity:    Days per week: Not on file    Minutes per session: Not on file  . Stress: Not on file  Relationships  . Social connections:    Talks on phone: Not on file    Gets together: Not on file    Attends religious service: Not on file    Active member of club or organization: Not on file    Attends meetings of clubs or organizations: Not on file    Relationship status: Not on file  Other Topics Concern  . Not on file  Social History Narrative  . Not on file     Family History:  The patient's family history is not on file.   ROS:   Please see the history of present illness.    ROS All other systems reviewed and are negative.   PHYSICAL EXAM:   VS:  BP 132/81   Pulse (!) 54   Ht 6\' 1"  (1.854 m)   Wt 159 lb (72.1 kg)   BMI 20.98 kg/m    GEN: Well nourished, well developed, in no acute distress  HEENT: normal  Neck: no JVD, carotid bruits, or masses Cardiac: RRR; no murmurs, rubs, or gallops,no edema  Respiratory:  clear to auscultation bilaterally, normal work of breathing GI: soft, nontender, nondistended, + BS MS: no deformity or atrophy  Skin: warm and dry, no rash Neuro:  Alert and Oriented x 3, Strength and sensation are intact Psych: euthymic mood, full affect  Wt Readings from Last 3 Encounters:    03/28/18 159 lb (72.1 kg)  03/17/18 160 lb (72.6 kg)  03/07/18 151 lb (68.5 kg)      Studies/Labs Reviewed:   EKG:  EKG is not ordered today.   Recent Labs: 03/06/2018: ALT 14 03/17/2018: BUN 11; Creatinine, Ser 0.90; Hemoglobin 14.9; Platelets 292; Potassium 3.3; Sodium 138   Lipid Panel    Component Value Date/Time   CHOL 187 03/06/2018 0941   TRIG 47 03/06/2018 0941   HDL 51 03/06/2018 0941   CHOLHDL 3.7 03/06/2018 0941   VLDL 9 03/06/2018 0941   LDLCALC 127 (  H) 03/06/2018 0941    Additional studies/ records that were reviewed today include:   Cath 03/06/2018  There is hyperdynamic left ventricular systolic function. The left ventricular ejection fraction is greater than 65% by visual estimate.  LV end diastolic pressure is normal.  Angiographically Normal Coronary Arteries.   Angiographically normal coronary arteries with no evidence of lesion or spasm to explain transient ST elevations. Normal LVEF with hyperdynamic function and normal EDP.  Plan: We will admit to telemetry.  Follow cardiac enzymes.    Check 2D echocardiogram to exclude pericardial effusion/pericarditis.  Consider treatment for possible coronary spasm given transient ST elevations.  Will start on low-dose diltiazem.  No indication for antiplatelet therapy at this time.     Echo 03/07/2018 LV EF: 55% -   60% Study Conclusions  - Left ventricle: The cavity size was normal. Wall thickness was   normal. Systolic function was normal. The estimated ejection   fraction was in the range of 55% to 60%.   ASSESSMENT:    1. Chest pain, unspecified type      PLAN:  In order of problems listed above:  1. Chest pain: Recent cardiac catheterization was normal.  Echocardiogram does not show significant pericardial effusion.  He has finished a short course of ibuprofen, will need to finish 3 months of colchicine and stop after that.  No further work-up is recommended at this time.  He will  always have some early repol with J-point elevation in inferior leads on EKG, although it is unclear to me what caused the EKG changes that was seen prior to cardiac catheterization.     Medication Adjustments/Labs and Tests Ordered: Current medicines are reviewed at length with the patient today.  Concerns regarding medicines are outlined above.  Medication changes, Labs and Tests ordered today are listed in the Patient Instructions below. Patient Instructions  Medication Instructions:  Complete Colchicine STOP on 06/07/2018  Labwork: None   Testing/Procedures: None   Follow-Up: Your physician recommends that you schedule a follow-up appointment in: 6-9 months with Dr Herbie BaltimoreHarding.  Any Other Special Instructions Will Be Listed Below (If Applicable). If you need a refill on your cardiac medications before your next appointment, please call your pharmacy.     Ramond DialSigned, Grettel Rames, [place]  03/28/2018 8:51 AM    [facility] [address], [place], [place]  [address] Phone: [phone]; Fax: [phone]

## 2018-03-29 ENCOUNTER — Encounter (HOSPITAL_COMMUNITY): Payer: Self-pay | Admitting: Emergency Medicine

## 2018-03-29 ENCOUNTER — Emergency Department (HOSPITAL_COMMUNITY)
Admission: EM | Admit: 2018-03-29 | Discharge: 2018-03-30 | Disposition: A | Discharge status: Home / Self Care | Payer: Self-pay | Attending: Emergency Medicine | Admitting: Emergency Medicine

## 2018-03-29 ENCOUNTER — Other Ambulatory Visit: Payer: Self-pay

## 2018-03-29 ENCOUNTER — Emergency Department (HOSPITAL_COMMUNITY): Payer: Self-pay

## 2018-03-29 DIAGNOSIS — R079 Chest pain, unspecified: Secondary | ICD-10-CM | POA: Insufficient documentation

## 2018-03-29 DIAGNOSIS — Z5321 Procedure and treatment not carried out due to patient leaving prior to being seen by health care provider: Secondary | ICD-10-CM | POA: Insufficient documentation

## 2018-03-29 LAB — CBC
HEMATOCRIT: 42.8 % (ref 39.0–52.0)
Hemoglobin: 14.8 g/dL (ref 13.0–17.0)
MCH: 33.1 pg (ref 26.0–34.0)
MCHC: 34.6 g/dL (ref 30.0–36.0)
MCV: 95.7 fL (ref 78.0–100.0)
PLATELETS: 372 10*3/uL (ref 150–400)
RBC: 4.47 MIL/uL (ref 4.22–5.81)
RDW: 11.5 % (ref 11.5–15.5)
WBC: 6.6 10*3/uL (ref 4.0–10.5)

## 2018-03-29 LAB — BASIC METABOLIC PANEL
ANION GAP: 10 (ref 5–15)
BUN: 11 mg/dL (ref 6–20)
CO2: 24 mmol/L (ref 22–32)
CREATININE: 1.04 mg/dL (ref 0.61–1.24)
Calcium: 9.5 mg/dL (ref 8.9–10.3)
Chloride: 107 mmol/L (ref 98–111)
GFR calc Af Amer: >60 mL/min (ref >60)
GLUCOSE: 113 mg/dL — AB (ref 70–99)
Potassium: 3.8 mmol/L (ref 3.5–5.1)
Sodium: 141 mmol/L (ref 135–145)

## 2018-03-29 LAB — I-STAT TROPONIN, ED: Troponin i, poc: 0 ng/mL (ref 0.00–0.08)

## 2018-03-29 NOTE — ED Triage Notes (Signed)
Pt presents to ED for assessment of left swided chest pain starting 30 minutes ago.  Hx of pericarditis, ST elevation, and cath with "all clear" result.  Patient states he took 1 nitro with some relief.

## 2018-03-30 NOTE — ED Notes (Signed)
Attempted to call pt x2

## 2018-03-30 NOTE — ED Notes (Signed)
Called for vitals x2 

## 2018-04-01 NOTE — ED Notes (Signed)
Follow up call made  Pt states he is feeling better  Will return if needed  04/01/18  1056  s Jamiere Gulas rn

## 2019-08-16 IMAGING — CR DG CHEST 2V
2 series · 2 of 2 positions shown · non-contrast
Comparison: None.

CLINICAL DATA: Chest pain for 2 days

EXAM:
CHEST - 2 VIEW

[w chest pa]
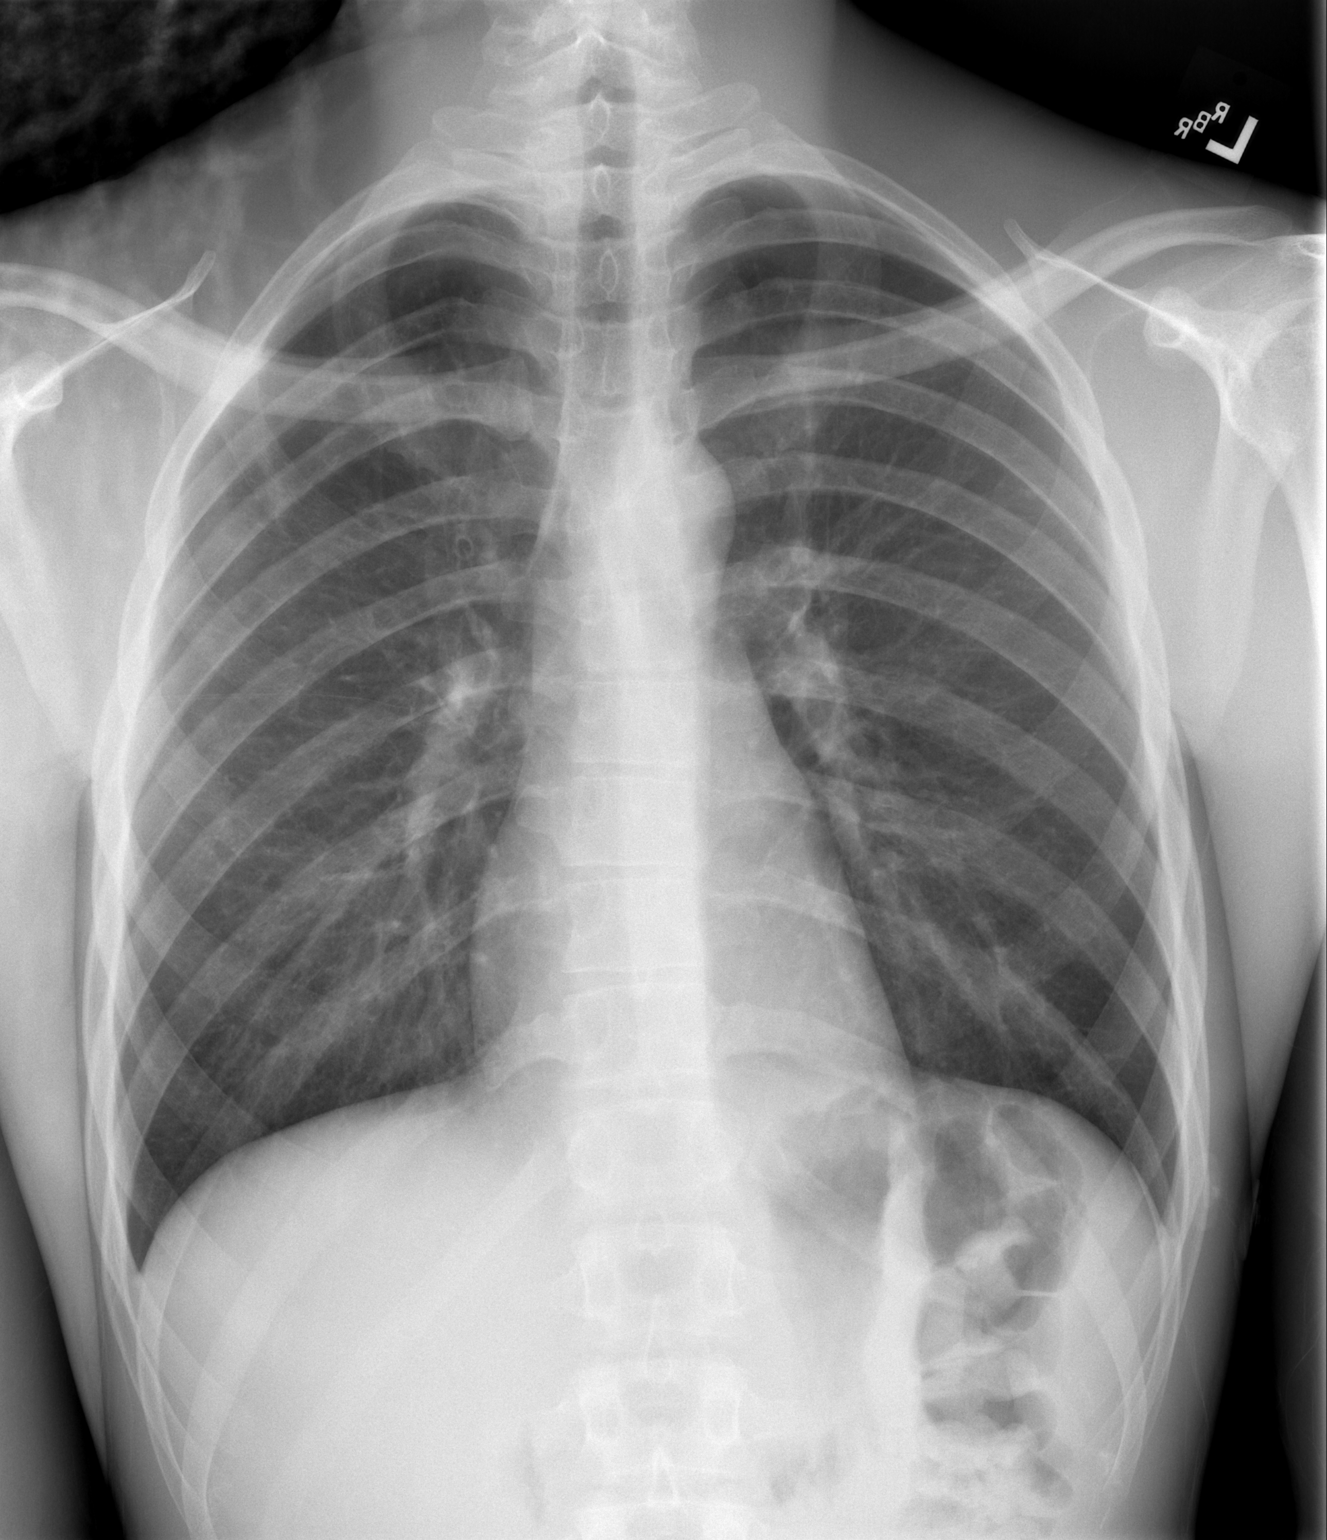

[w chest lat]
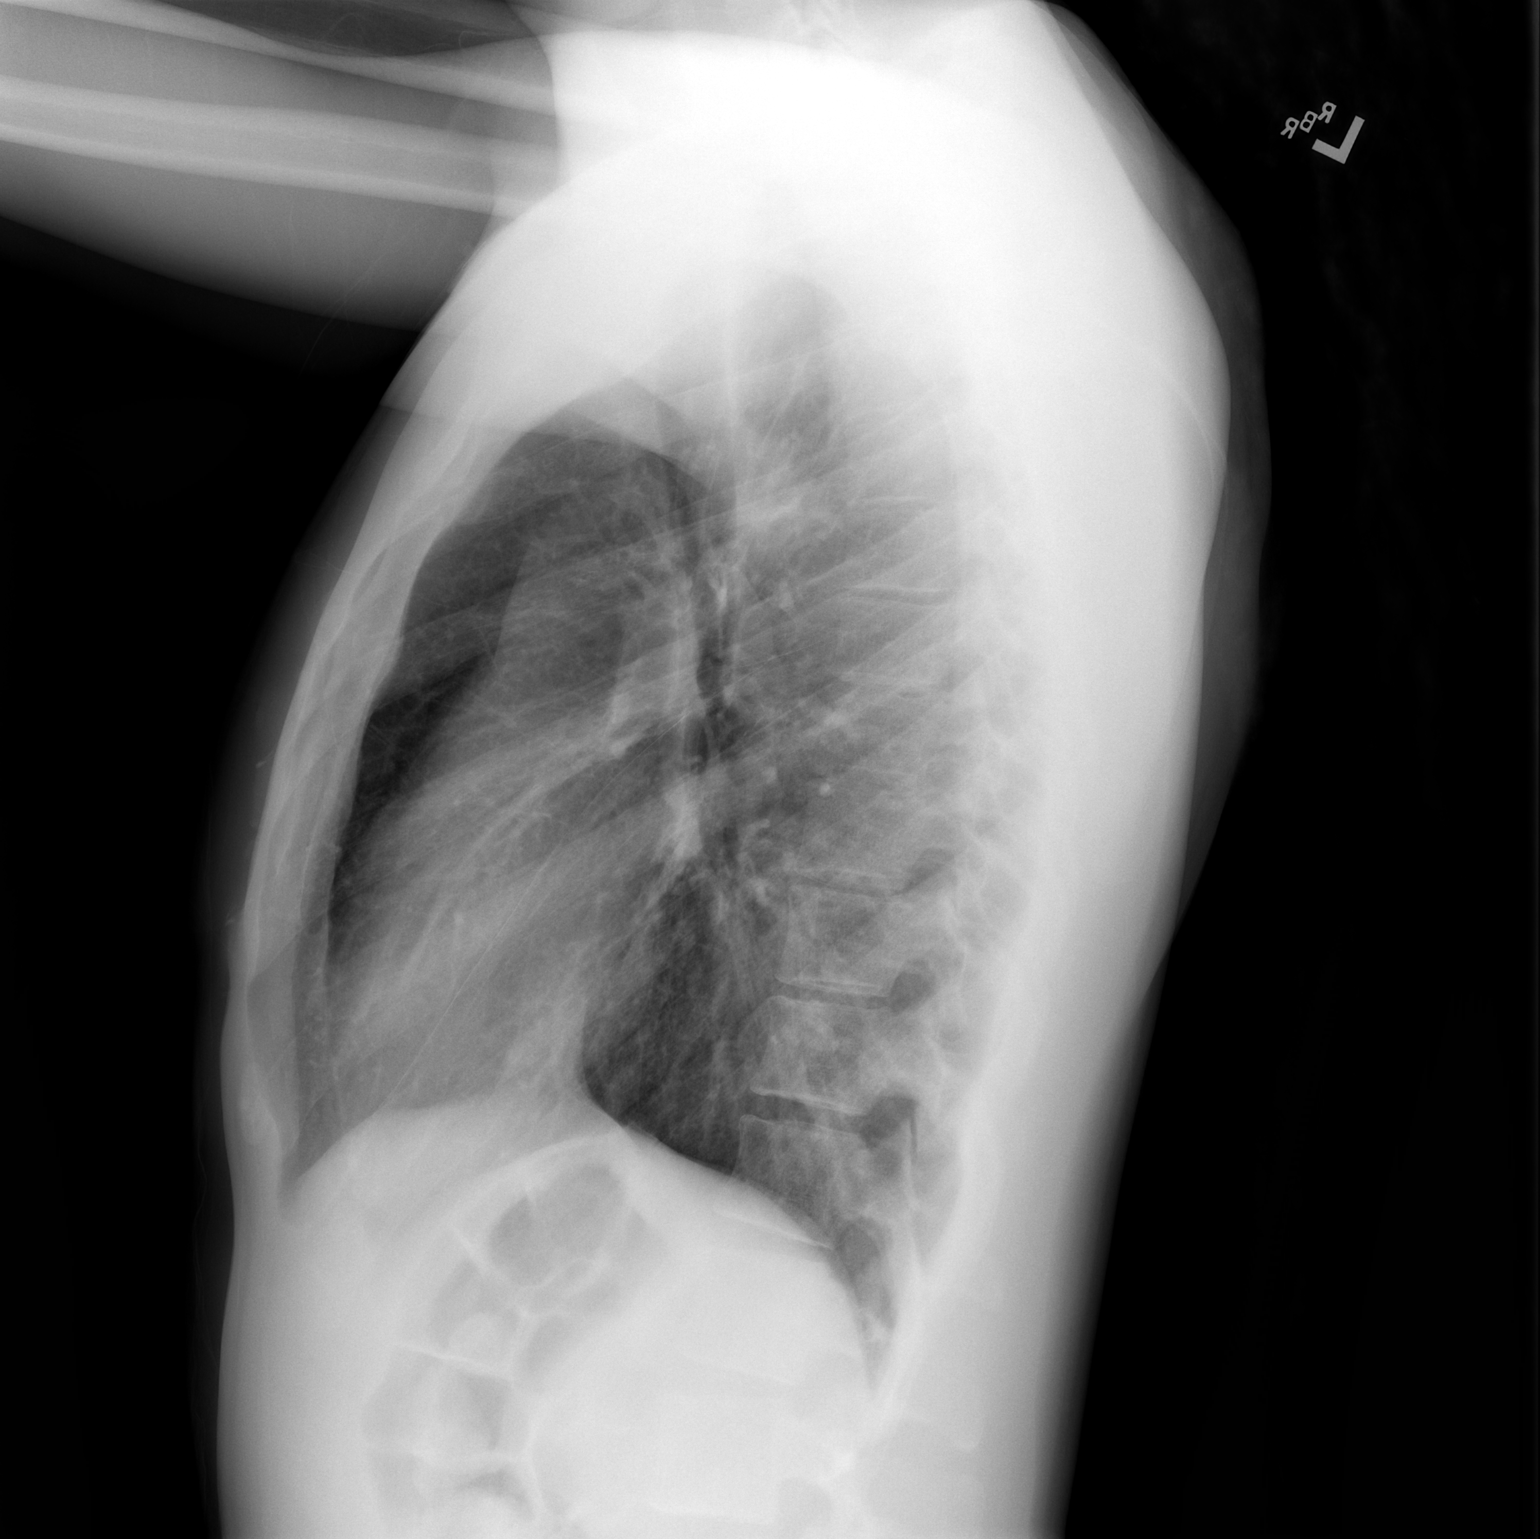

[2 of 2 positions shown; findings below may reference images not displayed]

FINDINGS: No active infiltrate or effusion is seen. Mediastinal and hilar
contours are unremarkable. The heart is within normal limits in
size. No bony abnormality is seen.
IMPRESSION: No active cardiopulmonary disease.

## 2019-08-27 IMAGING — DX DG CHEST 2V
2 series · 2 of 2 positions shown · non-contrast
Comparison: Chest x-ray 03/06/2018

CLINICAL DATA: Chest pain since 330 this morning. History of
smoking.

EXAM:
CHEST - 2 VIEW

[chest pa]
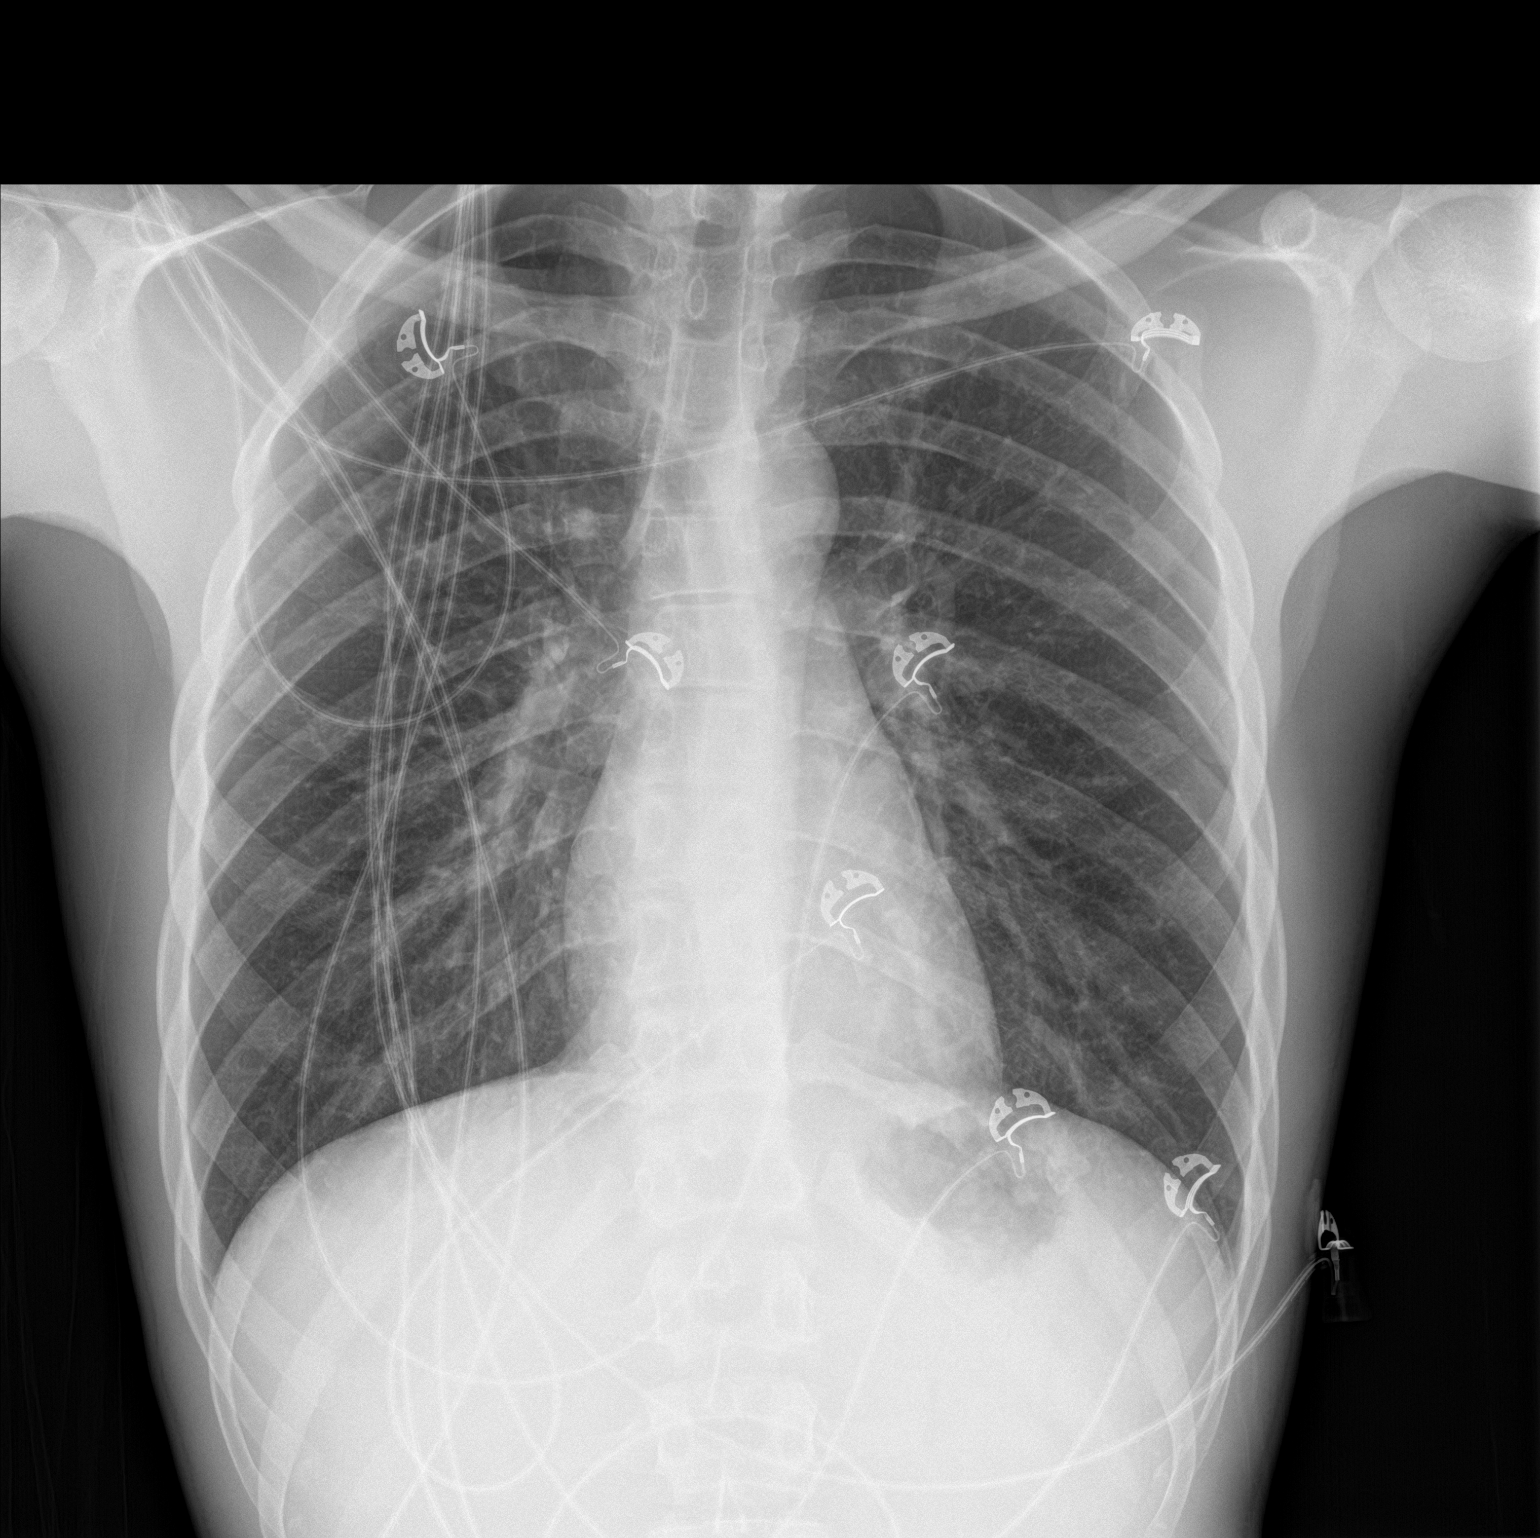

[chest lat]
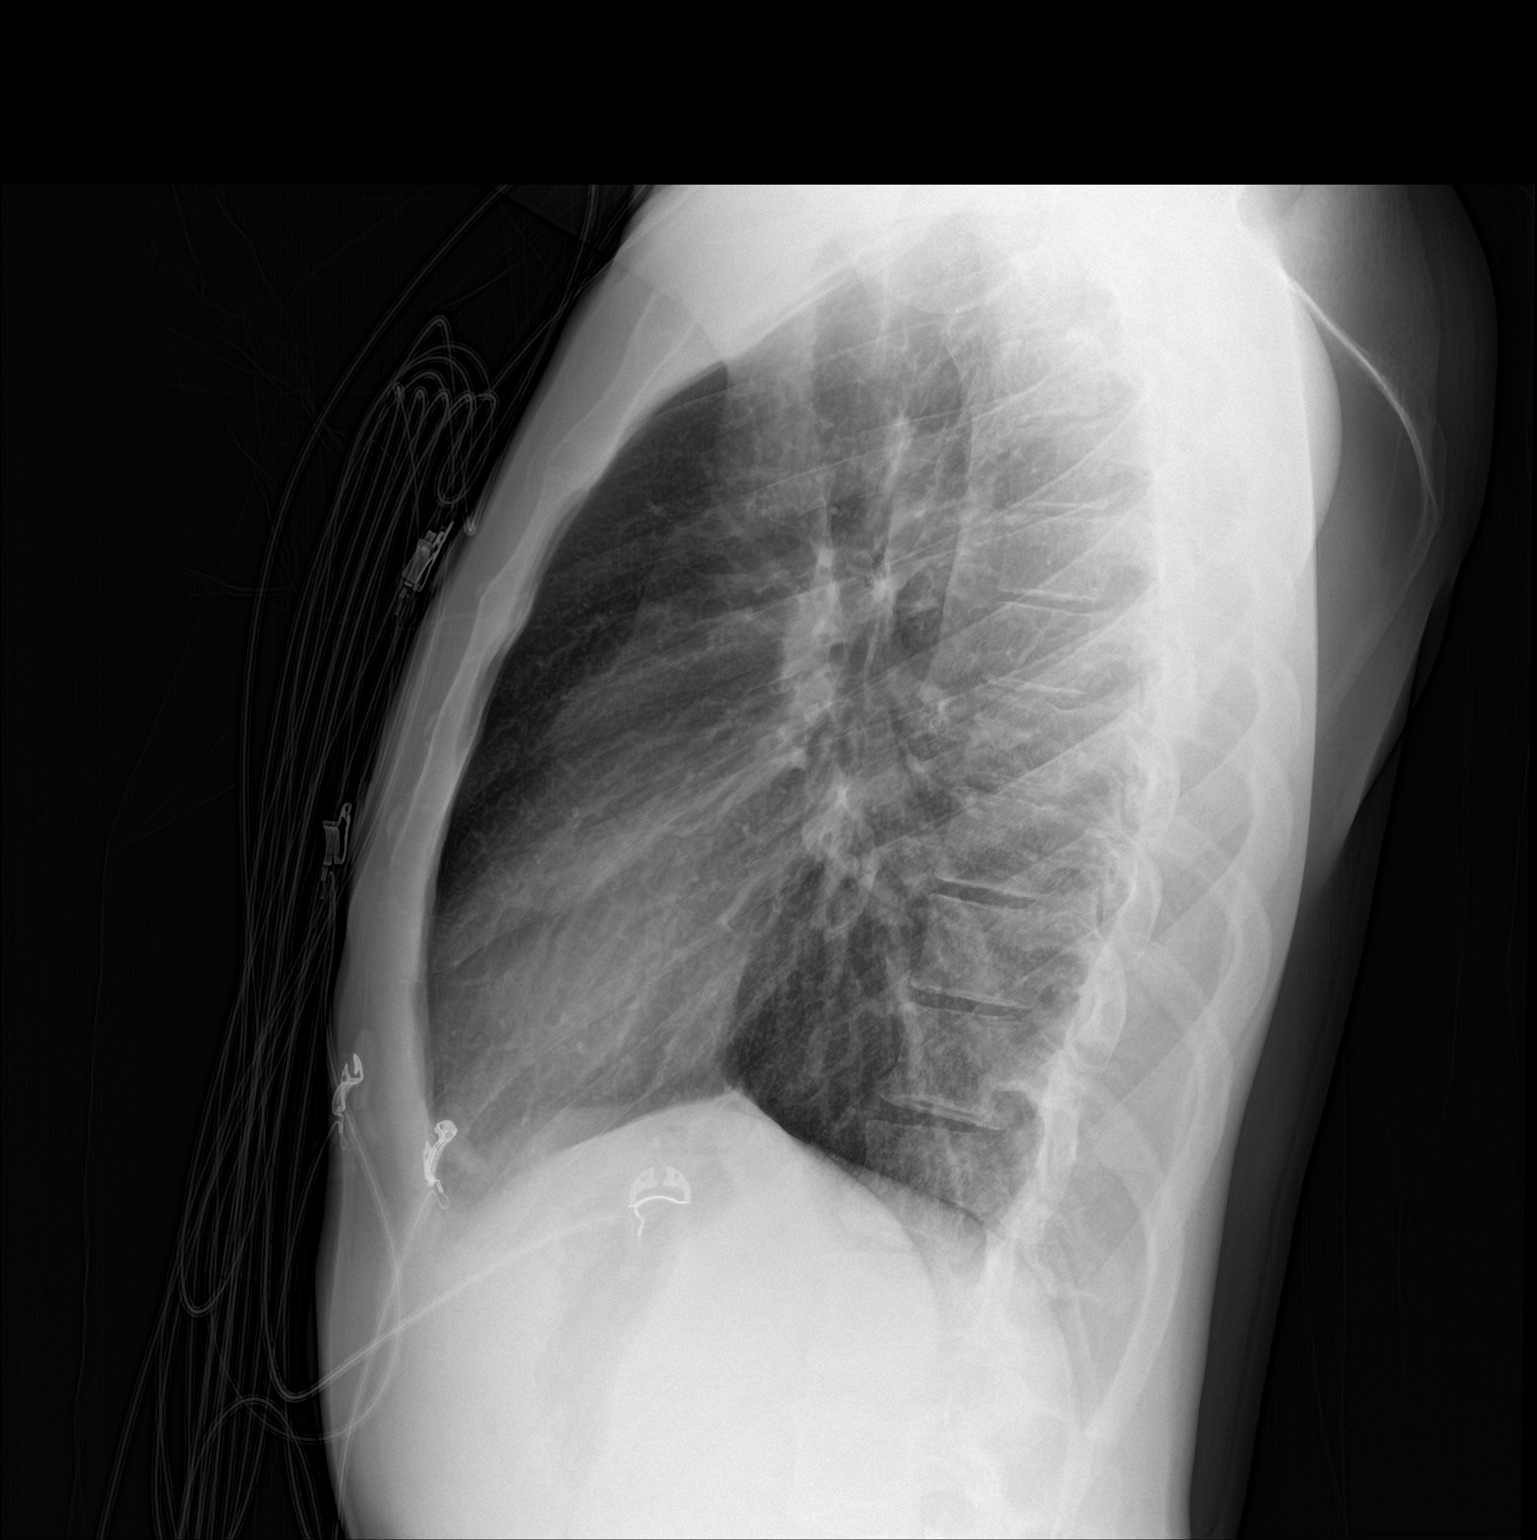

[2 of 2 positions shown; findings below may reference images not displayed]

FINDINGS: The cardiac silhouette, mediastinal and hilar contours are within
normal limits and stable. The lungs are clear. No pleural effusion.
No pneumothorax. The bony thorax is intact.
IMPRESSION: No acute cardiopulmonary findings.

## 2019-09-08 IMAGING — DX DG CHEST 2V
2 series · 2 of 2 positions shown · non-contrast
Comparison: 03/17/2018 and 03/06/2017.

CLINICAL DATA: Left-sided chest pain for 30 minutes. History of
pericarditis.

EXAM:
CHEST - 2 VIEW

[w chest pa]
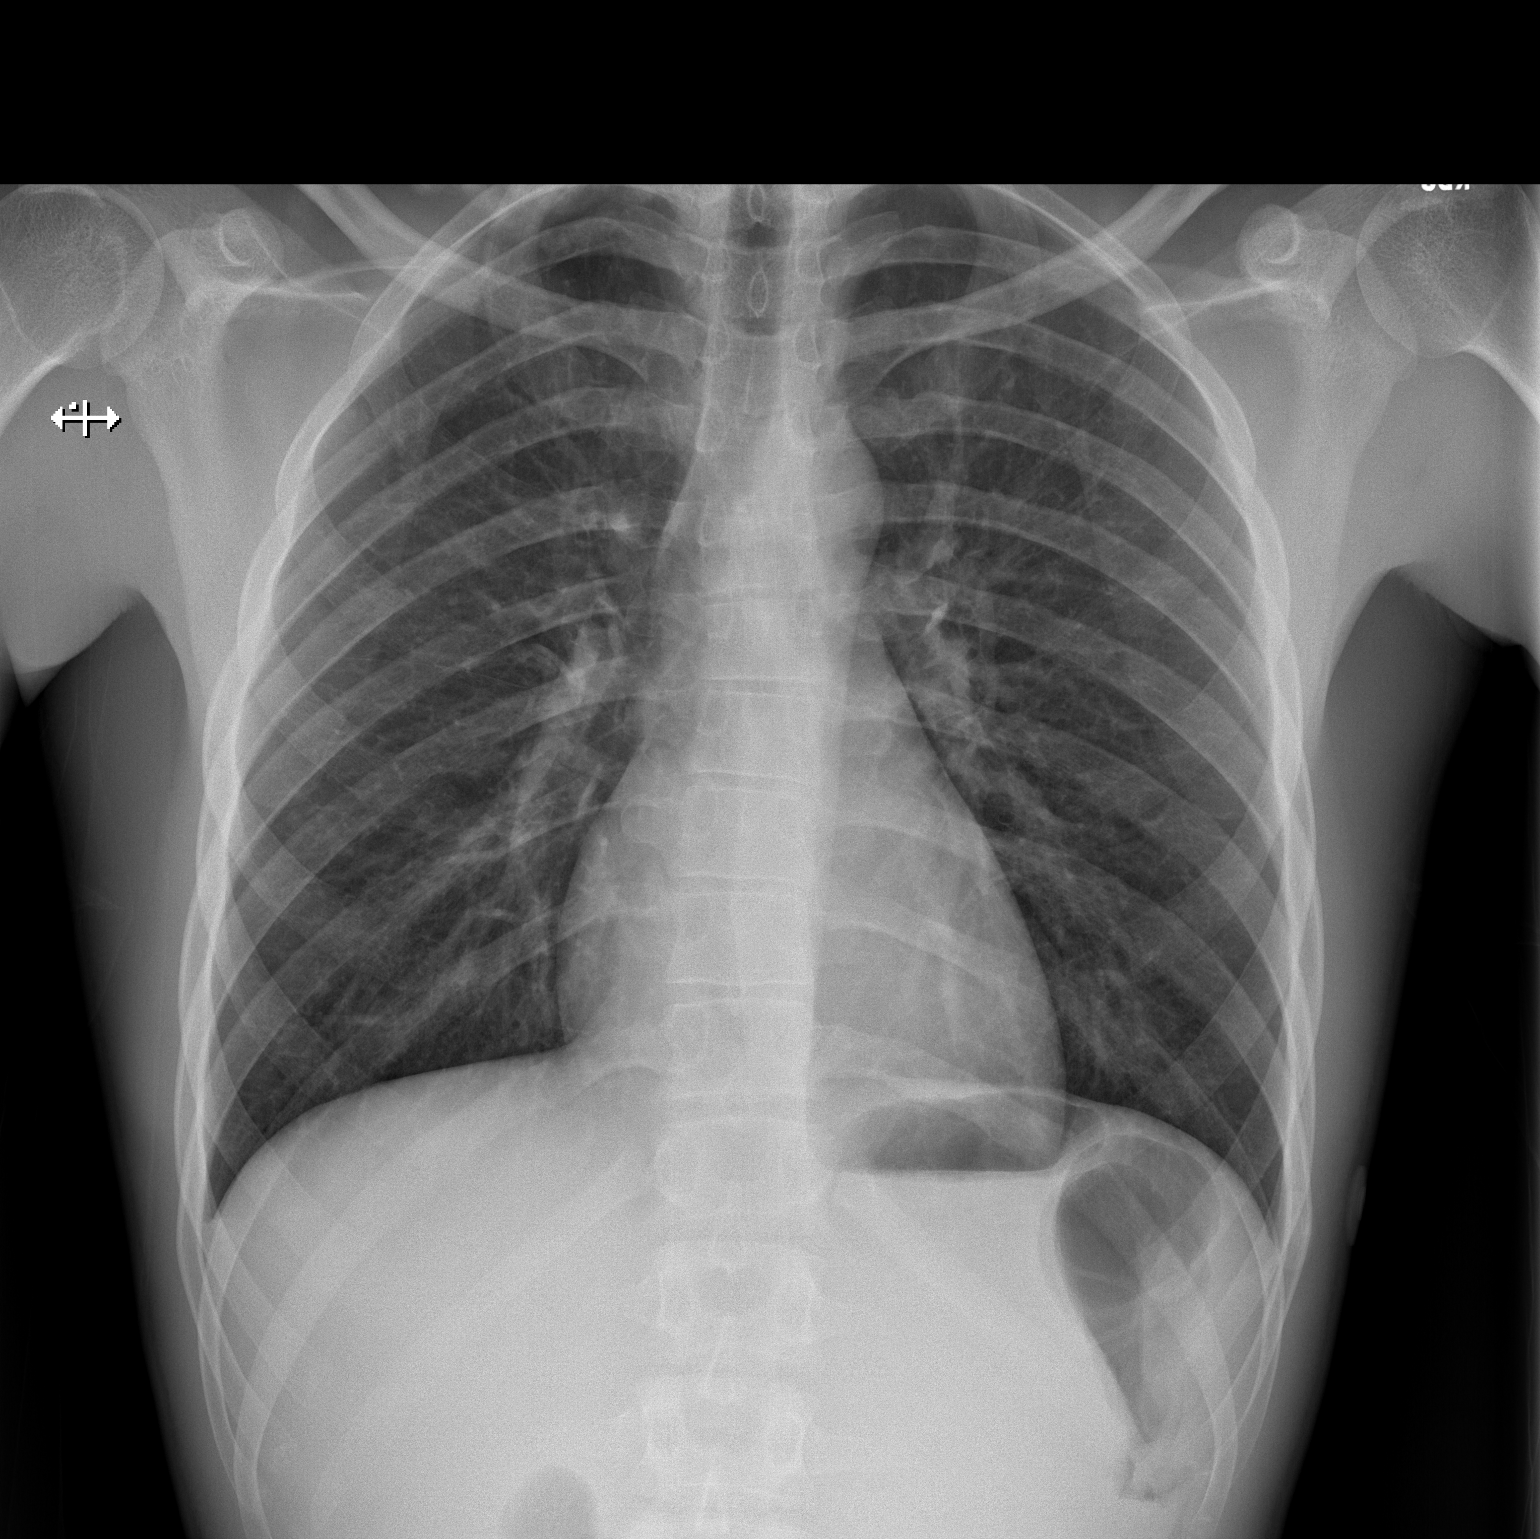

[w chest lat]
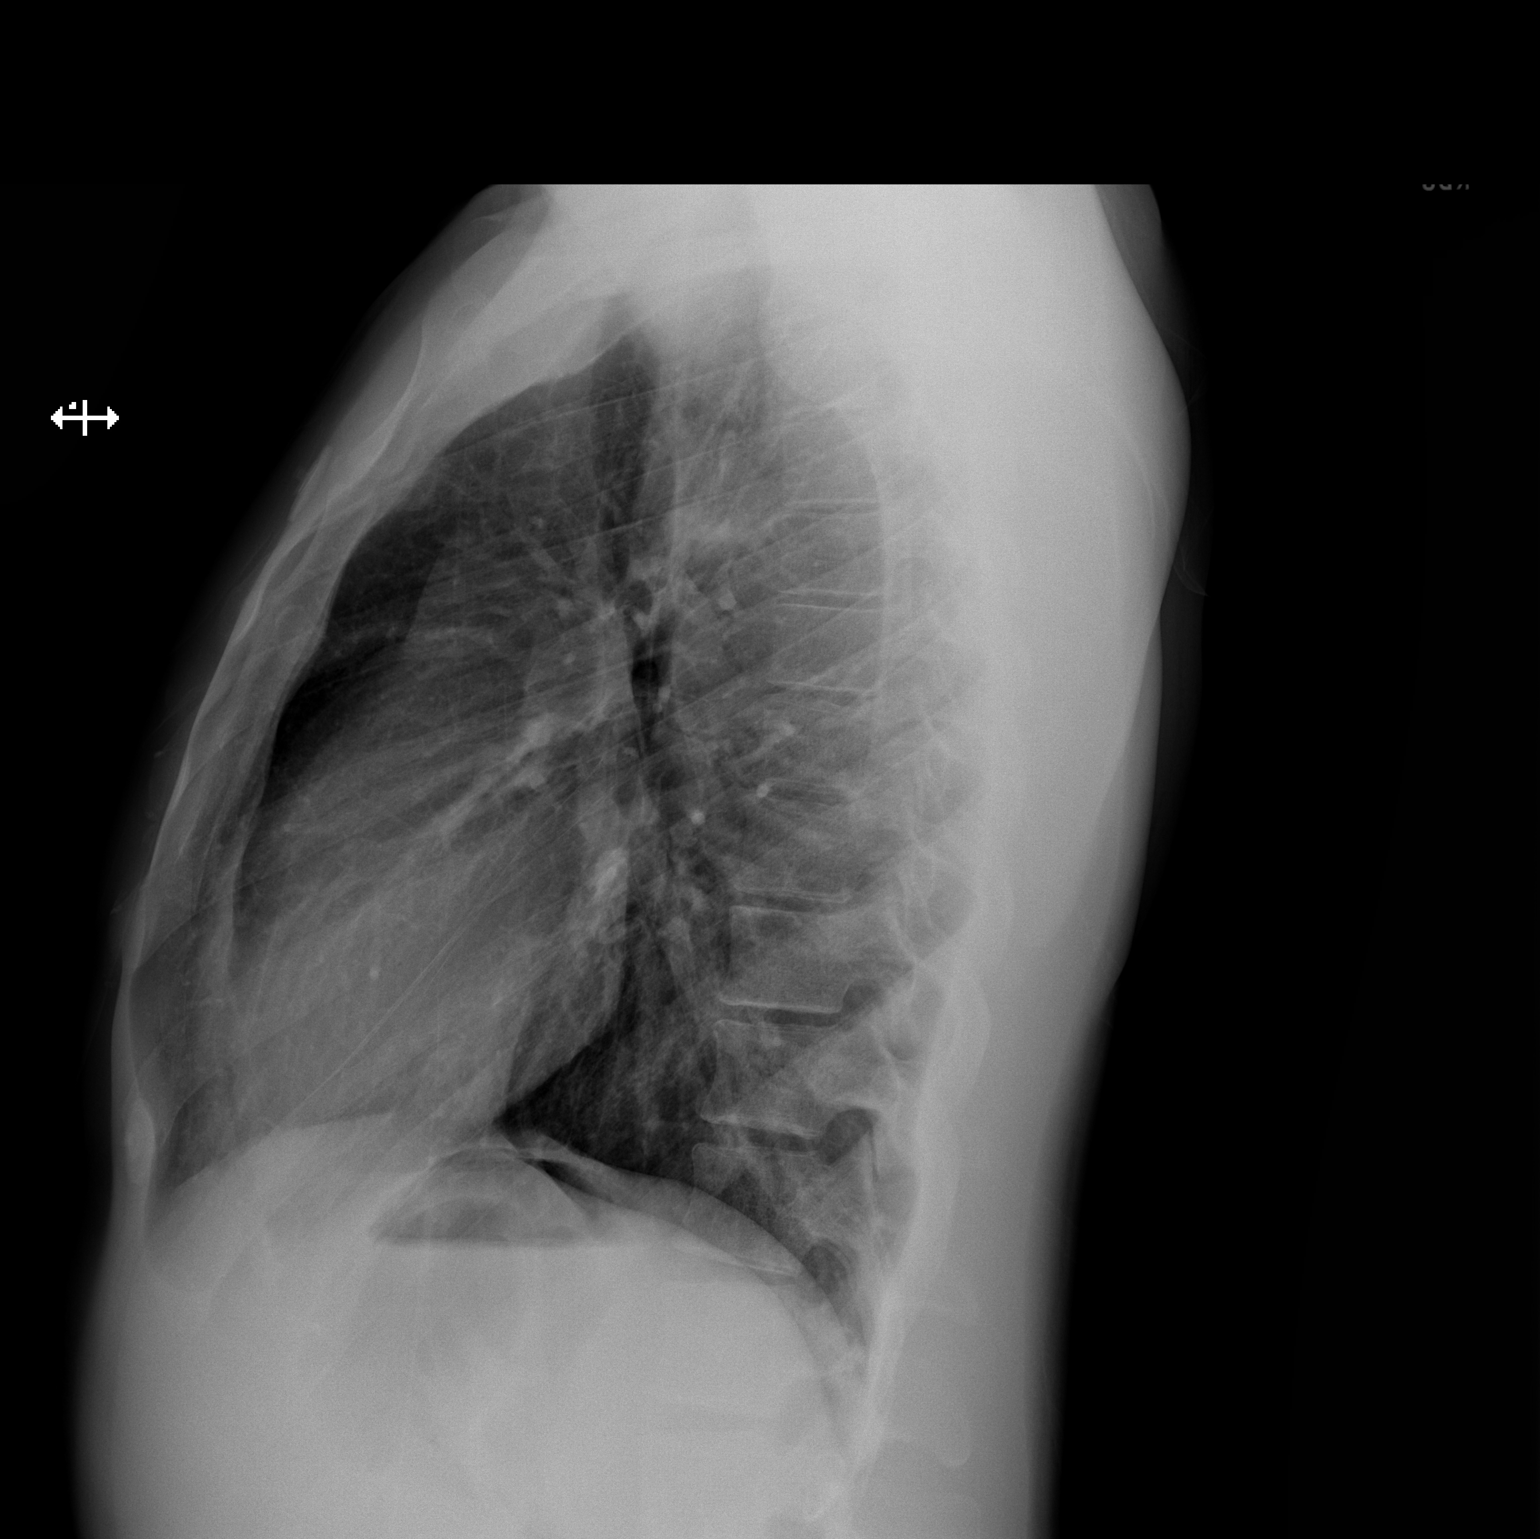

[2 of 2 positions shown; findings below may reference images not displayed]

FINDINGS: The heart size and mediastinal contours are normal. The lungs are
clear. There is no pleural effusion or pneumothorax. No acute
osseous findings are identified. Hair braid artifacts overlie the
lower neck and right apex on the frontal examination.
IMPRESSION: No active cardiopulmonary process.

## 2019-11-03 ENCOUNTER — Encounter: Payer: Self-pay | Admitting: General Practice

## 2020-08-09 ENCOUNTER — Other Ambulatory Visit: Payer: Self-pay

## 2020-08-09 ENCOUNTER — Emergency Department (HOSPITAL_BASED_OUTPATIENT_CLINIC_OR_DEPARTMENT_OTHER): Payer: Self-pay

## 2020-08-09 ENCOUNTER — Encounter (HOSPITAL_BASED_OUTPATIENT_CLINIC_OR_DEPARTMENT_OTHER): Payer: Self-pay | Admitting: *Deleted

## 2020-08-09 ENCOUNTER — Emergency Department (HOSPITAL_BASED_OUTPATIENT_CLINIC_OR_DEPARTMENT_OTHER)
Admission: EM | Admit: 2020-08-09 | Discharge: 2020-08-09 | Disposition: A | Discharge status: Home / Self Care | Payer: Self-pay | Attending: Emergency Medicine | Admitting: Emergency Medicine

## 2020-08-09 DIAGNOSIS — Z79899 Other long term (current) drug therapy: Secondary | ICD-10-CM | POA: Insufficient documentation

## 2020-08-09 DIAGNOSIS — S40012A Contusion of left shoulder, initial encounter: Secondary | ICD-10-CM | POA: Insufficient documentation

## 2020-08-09 DIAGNOSIS — I2119 ST elevation (STEMI) myocardial infarction involving other coronary artery of inferior wall: Secondary | ICD-10-CM | POA: Insufficient documentation

## 2020-08-09 DIAGNOSIS — F17210 Nicotine dependence, cigarettes, uncomplicated: Secondary | ICD-10-CM | POA: Insufficient documentation

## 2020-08-09 MED ORDER — DICLOFENAC SODIUM 75 MG PO TBEC: 75.0000 mg | DELAYED_RELEASE_TABLET | Freq: Two times a day (BID) | ORAL | 0 refills | Status: AC
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
## 2020-08-09 NOTE — Discharge Instructions (Addendum)
Return if any problems.
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
## 2021-01-04 ENCOUNTER — Ambulatory Visit: Payer: Self-pay

## 2022-06-22 ENCOUNTER — Encounter (HOSPITAL_COMMUNITY): Payer: Self-pay | Admitting: Emergency Medicine

## 2022-06-22 ENCOUNTER — Other Ambulatory Visit: Payer: Self-pay

## 2022-06-22 ENCOUNTER — Emergency Department (HOSPITAL_COMMUNITY): Payer: Self-pay

## 2022-06-22 ENCOUNTER — Emergency Department (HOSPITAL_COMMUNITY)
Admission: EM | Admit: 2022-06-22 | Discharge: 2022-06-22 | Discharge status: Against Medical Advice | Payer: Self-pay | Attending: Emergency Medicine | Admitting: Emergency Medicine

## 2022-06-22 DIAGNOSIS — Z5321 Procedure and treatment not carried out due to patient leaving prior to being seen by health care provider: Secondary | ICD-10-CM | POA: Insufficient documentation

## 2022-06-22 DIAGNOSIS — I309 Acute pericarditis, unspecified: Secondary | ICD-10-CM | POA: Insufficient documentation

## 2022-06-22 LAB — BASIC METABOLIC PANEL WITH GFR
Anion gap: 11 (ref 5–15)
BUN: 14 mg/dL (ref 6–20)
CO2: 25 mmol/L (ref 22–32)
Calcium: 9.4 mg/dL (ref 8.9–10.3)
Chloride: 105 mmol/L (ref 98–111)
Creatinine, Ser: 0.83 mg/dL (ref 0.61–1.24)
GFR, Estimated: >60 mL/min
Glucose, Bld: 100 mg/dL — ABNORMAL HIGH (ref 70–99)
Potassium: 3.5 mmol/L (ref 3.5–5.1)
Sodium: 141 mmol/L (ref 135–145)

## 2022-06-22 LAB — CBC
HCT: 40.9 % (ref 39.0–52.0)
Hemoglobin: 14.6 g/dL (ref 13.0–17.0)
MCH: 34.2 pg — ABNORMAL HIGH (ref 26.0–34.0)
MCHC: 35.7 g/dL (ref 30.0–36.0)
MCV: 95.8 fL (ref 80.0–100.0)
Platelets: 278 10*3/uL (ref 150–400)
RBC: 4.27 MIL/uL (ref 4.22–5.81)
RDW: 12 % (ref 11.5–15.5)
WBC: 11.8 10*3/uL — ABNORMAL HIGH (ref 4.0–10.5)
nRBC: 0 % (ref 0.0–0.2)

## 2022-06-22 LAB — TROPONIN I (HIGH SENSITIVITY): Troponin I (High Sensitivity): <2 ng/L (ref <18)

## 2022-06-22 NOTE — ED Notes (Signed)
Unable to loctae in lobby

## 2022-06-22 NOTE — ED Triage Notes (Signed)
EMS stated, chest pain woke him up this morning around 400. He had pericarditis. Chest pain relieved when he leans forward. IV 20g in [facility] ASA 324mg 

## 2022-06-22 NOTE — ED Provider Triage Note (Signed)
Emergency Medicine Provider Triage Evaluation Note  Donald Buck , a 31 y.o. male  was evaluated in triage.  Pt complains of chest pain.  Woke up at 4 AM with pain in the center of his chest, improved when he leans forward.  Had pericarditis about 4 to 5 years ago that felt similar.  No associated shortness of breath.  No radiation of pain, no nausea, vomiting, lightheadedness or syncope.  Had mild cold symptoms about a week ago.  Review of Systems  Positive: Chest pain Negative: Fevers, chills, cough, shortness of breath, syncope  Physical Exam  BP 127/82 (BP Location: Left Arm)   Pulse 72   Temp 98 F (36.7 C) (Oral)   Resp 16   Ht 6\' 4"  (1.93 m)   Wt 72.6 kg   SpO2 98%   BMI 19.48 kg/m  Gen:   Awake, no distress   Resp:  Normal effort, CTA bilat MSK:   Moves extremities without difficulty  Other:    Medical Decision Making  Medically screening exam initiated at 7:50 AM.  Appropriate orders placed.  Nova Evett was informed that the remainder of the evaluation will be completed by another provider, this initial triage assessment does not replace that evaluation, and the importance of remaining in the ED until their evaluation is complete.     Jacqlyn Larsen, [place] 06/22/22 [phone]

## 2022-12-21 ENCOUNTER — Other Ambulatory Visit: Payer: Self-pay

## 2022-12-21 ENCOUNTER — Other Ambulatory Visit (HOSPITAL_BASED_OUTPATIENT_CLINIC_OR_DEPARTMENT_OTHER): Payer: Self-pay

## 2022-12-21 ENCOUNTER — Emergency Department (HOSPITAL_BASED_OUTPATIENT_CLINIC_OR_DEPARTMENT_OTHER): Payer: Self-pay

## 2022-12-21 ENCOUNTER — Emergency Department (HOSPITAL_BASED_OUTPATIENT_CLINIC_OR_DEPARTMENT_OTHER)
Admission: EM | Admit: 2022-12-21 | Discharge: 2022-12-21 | Disposition: A | Discharge status: Home / Self Care | Payer: Self-pay | Attending: Emergency Medicine | Admitting: Emergency Medicine

## 2022-12-21 DIAGNOSIS — K047 Periapical abscess without sinus: Secondary | ICD-10-CM

## 2022-12-21 DIAGNOSIS — K0381 Cracked tooth: Secondary | ICD-10-CM | POA: Insufficient documentation

## 2022-12-21 LAB — BASIC METABOLIC PANEL
Anion gap: 7 (ref 5–15)
BUN: 18 mg/dL (ref 6–20)
CO2: 25 mmol/L (ref 22–32)
Calcium: 8.8 mg/dL — ABNORMAL LOW (ref 8.9–10.3)
Chloride: 103 mmol/L (ref 98–111)
Creatinine, Ser: 0.89 mg/dL (ref 0.61–1.24)
GFR, Estimated: >60 mL/min (ref >60)
Glucose, Bld: 99 mg/dL (ref 70–99)
Potassium: 3.2 mmol/L — ABNORMAL LOW (ref 3.5–5.1)
Sodium: 135 mmol/L (ref 135–145)

## 2022-12-21 LAB — CBC WITH DIFFERENTIAL/PLATELET
Abs Immature Granulocytes: 0.03 10*3/uL (ref 0.00–0.07)
Basophils Absolute: 0 10*3/uL (ref 0.0–0.1)
Basophils Relative: 1 %
Eosinophils Absolute: 0.1 10*3/uL (ref 0.0–0.5)
Eosinophils Relative: 2 %
HCT: 42.7 % (ref 39.0–52.0)
Hemoglobin: 15.5 g/dL (ref 13.0–17.0)
Immature Granulocytes: 0 %
Lymphocytes Relative: 14 %
Lymphs Abs: 1.1 10*3/uL (ref 0.7–4.0)
MCH: 33.8 pg (ref 26.0–34.0)
MCHC: 36.3 g/dL — ABNORMAL HIGH (ref 30.0–36.0)
MCV: 93 fL (ref 80.0–100.0)
Monocytes Absolute: 0.6 10*3/uL (ref 0.1–1.0)
Monocytes Relative: 7 %
Neutro Abs: 6.3 10*3/uL (ref 1.7–7.7)
Neutrophils Relative %: 76 %
Platelets: 277 10*3/uL (ref 150–400)
RBC: 4.59 MIL/uL (ref 4.22–5.81)
RDW: 11.4 % — ABNORMAL LOW (ref 11.5–15.5)
WBC: 8.2 10*3/uL (ref 4.0–10.5)
nRBC: 0 % (ref 0.0–0.2)

## 2022-12-21 MED ORDER — MORPHINE SULFATE (PF) 4 MG/ML IV SOLN
4.0000 mg | Freq: Once | INTRAVENOUS | Status: AC
  Administered 2022-12-21: 4 mg via INTRAVENOUS
  Filled 2022-12-21: qty 1

## 2022-12-21 MED ORDER — SODIUM CHLORIDE 0.9 % IV BOLUS
500.0000 mL | Freq: Once | INTRAVENOUS | Status: AC
  Administered 2022-12-21: 500 mL via INTRAVENOUS

## 2022-12-21 MED ORDER — HYDROCODONE-ACETAMINOPHEN 5-325 MG PO TABS
1.0000 | ORAL_TABLET | Freq: Four times a day (QID) | ORAL | 0 refills | Status: AC | PRN
  Filled 2022-12-21: qty 15, 4d supply, fill #0

## 2022-12-21 MED ORDER — AMOXICILLIN-POT CLAVULANATE 875-125 MG PO TABS
1.0000 | ORAL_TABLET | Freq: Two times a day (BID) | ORAL | 0 refills | Status: DC
  Filled 2022-12-21: qty 14, 7d supply, fill #0

## 2022-12-21 NOTE — ED Triage Notes (Signed)
C/O swelling on the left side of face by the nose and upper lip. Reports taking goody powder and allergy pill for seasonal allergies and he noticed this swelling today.

## 2022-12-21 NOTE — ED Provider Notes (Signed)
[place] EMERGENCY DEPARTMENT AT [facility] Provider Note   CSN: [id number] Arrival date & time: 12/21/22  4098     History  Chief Complaint  Patient presents with   Facial Swelling    Donald Buck is a 32 y.o. male.  HPI   32 year old male presents emergency department with left upper face swelling.  Patient states a little over a month ago while eating something he cracked his left upper tooth.  Since then he has been experiencing pain in that area.  The pain is progressed up into the gum and up into his left cheek.  Has been using over-the-counter medications without significant improvement.  He presents today with worsening pain and now left-sided facial swelling.  He denies any pain/swelling extending down his throat.  No difficulty breathing or swallowing.  Denies any fever but endorses chills.  Decreased appetite secondary to pain with chewing.  Home Medications Prior to Admission medications   Medication Sig Start Date End Date Taking? Authorizing Provider  colchicine 0.6 MG tablet Take 1 tablet (0.6 mg total) by mouth 2 (two) times daily. 03/07/18   Filbert Schilder, NP  diclofenac (VOLTAREN) 75 MG EC tablet Take 1 tablet (75 mg total) by mouth 2 (two) times daily. 08/09/20   Elson Areas, PA-C  nitroGLYCERIN (NITROSTAT) 0.4 MG SL tablet Place 1 tablet (0.4 mg total) under the tongue every 5 (five) minutes as needed for chest pain. 03/07/18   Filbert Schilder, NP      Allergies    Patient has no known allergies.    Review of Systems   Review of Systems  Constitutional:  Positive for appetite change. Negative for fever.  HENT:  Positive for dental problem and facial swelling. Negative for drooling and trouble swallowing.   Respiratory:  Negative for shortness of breath.   Cardiovascular:  Negative for chest pain.  Gastrointestinal:  Negative for abdominal pain, diarrhea and vomiting.  Skin:  Negative for rash.  Neurological:  Negative for headaches.     Physical Exam Updated Vital Signs BP (!) 142/98 (BP Location: Right Arm)   Pulse 75   Temp 98 F (36.7 C)   Resp 17   Ht  (1.93 m)   Wt 72.6 kg   SpO2 98%   BMI 19.48 kg/m  Physical Exam Vitals and nursing note reviewed.  Constitutional:      General: He is not in acute distress.    Appearance: Normal appearance.  HENT:     Head: Normocephalic.      Comments: Edema involving the left side of the nose, cheek and left upper lip    Mouth/Throat:     Mouth: Mucous membranes are moist.      Comments: Cracked left upper molar with surrounding gum swelling, base of the tooth/root appears intact.  Very poor dentition and multiple cracked teeth Cardiovascular:     Rate and Rhythm: Normal rate.  Pulmonary:     Effort: Pulmonary effort is normal. No respiratory distress.  Abdominal:     Palpations: Abdomen is soft.     Tenderness: There is no abdominal tenderness.  Skin:    General: Skin is warm.  Neurological:     Mental Status: He is alert and oriented to person, place, and time. Mental status is at baseline.  Psychiatric:        Mood and Affect: Mood normal.     ED Results / Procedures / Treatments   Labs (all labs ordered  are listed, but only abnormal results are displayed) Labs Reviewed  CBC WITH DIFFERENTIAL/PLATELET - Abnormal; Notable for the following components:      Result Value   MCHC 36.3 (*)    RDW 11.4 (*)    All other components within normal limits  BASIC METABOLIC PANEL - Abnormal; Notable for the following components:   Potassium 3.2 (*)    Calcium 8.8 (*)    All other components within normal limits    EKG None  Radiology No results found.  Procedures Procedures    Medications Ordered in ED Medications  morphine (PF) 4 MG/ML injection 4 mg (4 mg Intravenous Given 12/21/22 0944)  sodium chloride 0.9 % bolus 500 mL (500 mLs Intravenous New Bag/Given 12/21/22 4098)    ED Course/ Medical Decision Making/ A&P                              Medical Decision Making Amount and/or Complexity of Data Reviewed Labs: ordered. Radiology: ordered.  Risk Prescription drug management.   32 year old male presents emergency department with left-sided facial swelling.  He is had a cracked upper molar for the past month that is becoming more painful.  Vital signs are stable on arrival, he is afebrile.  On exam he has multiple dental caries, poor dentition, multiple cracked tooth including a left upper molar where his pain is located.  He has some mild gum swelling and then facial swelling extending from the left cheek to the left side of the nose and under the eye.  No overlying skin changes.  Blood work is reassuring.  CT of the face shows multiple root abscesses and dental caries as well as sinusitis on the left.  Patient feels improved with pain medication.  Will plan for pain control and antibiotics and outpatient dental follow-up.  Patient at this time appears safe and stable for discharge and close outpatient follow up. Discharge plan and strict return to ED precautions discussed, patient verbalizes understanding and agreement.        Final Clinical Impression(s) / ED Diagnoses Final diagnoses:  None    Rx / DC Orders ED Discharge Orders     None         Donald Logan, DO 12/21/22 1332

## 2022-12-21 NOTE — Discharge Instructions (Signed)
You have been seen and discharged from the emergency department.  You were found to have dental infections and cavities.  Take antibiotic as directed.  It is extremely important that you follow-up with a dentist for more definitive treatment.  Take pain medicine as needed.  Do not mix this medication with alcohol or other sedating medications. Do not drive or do heavy physical activity until you know how this medication affects you.  It may cause drowsiness.  Follow-up with your primary provider for further evaluation and further care. Take home medications as prescribed. If you have any worsening symptoms or further concerns for your health please return to an emergency department for further evaluation.

## 2022-12-21 NOTE — ED Notes (Signed)

## 2023-03-12 ENCOUNTER — Other Ambulatory Visit: Payer: Self-pay

## 2023-03-12 ENCOUNTER — Emergency Department (HOSPITAL_BASED_OUTPATIENT_CLINIC_OR_DEPARTMENT_OTHER)
Admission: EM | Admit: 2023-03-12 | Discharge: 2023-03-12 | Disposition: A | Discharge status: Home / Self Care | Payer: Self-pay | Attending: Emergency Medicine | Admitting: Emergency Medicine

## 2023-03-12 ENCOUNTER — Emergency Department (HOSPITAL_BASED_OUTPATIENT_CLINIC_OR_DEPARTMENT_OTHER): Payer: Self-pay

## 2023-03-12 ENCOUNTER — Encounter (HOSPITAL_BASED_OUTPATIENT_CLINIC_OR_DEPARTMENT_OTHER): Payer: Self-pay | Admitting: Emergency Medicine

## 2023-03-12 DIAGNOSIS — S61411A Laceration without foreign body of right hand, initial encounter: Secondary | ICD-10-CM | POA: Insufficient documentation

## 2023-03-12 DIAGNOSIS — Z23 Encounter for immunization: Secondary | ICD-10-CM | POA: Insufficient documentation

## 2023-03-12 DIAGNOSIS — W503XXA Accidental bite by another person, initial encounter: Secondary | ICD-10-CM | POA: Insufficient documentation

## 2023-03-12 MED ORDER — TETANUS-DIPHTH-ACELL PERTUSSIS 5-2.5-18.5 LF-MCG/0.5 IM SUSY
0.5000 mL | PREFILLED_SYRINGE | Freq: Once | INTRAMUSCULAR | Status: AC
  Administered 2023-03-12: 0.5 mL via INTRAMUSCULAR
  Filled 2023-03-12: qty 0.5

## 2023-03-12 MED ORDER — AMOXICILLIN-POT CLAVULANATE 875-125 MG PO TABS: 1.0000 | ORAL_TABLET | Freq: Two times a day (BID) | ORAL | 0 refills | Status: AC

## 2023-03-12 MED ORDER — LIDOCAINE HCL (PF) 1 % IJ SOLN
5.0000 mL | Freq: Once | INTRAMUSCULAR | Status: AC
  Administered 2023-03-12: 5 mL
  Filled 2023-03-12: qty 5

## 2023-03-12 MED ORDER — BACITRACIN ZINC 500 UNIT/GM EX OINT
TOPICAL_OINTMENT | Freq: Once | CUTANEOUS | Status: AC
  Administered 2023-03-12: 1 via TOPICAL
  Filled 2023-03-12: qty 28.35
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
## 2023-03-12 NOTE — ED Notes (Signed)
Patient transported to X-ray 

## 2023-03-12 NOTE — ED Notes (Signed)
Wound has been irrigated and bacitracin applied and dressing applied
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
## 2023-03-20 ENCOUNTER — Other Ambulatory Visit: Payer: Self-pay

## 2023-03-20 ENCOUNTER — Emergency Department (HOSPITAL_BASED_OUTPATIENT_CLINIC_OR_DEPARTMENT_OTHER)
Admission: EM | Admit: 2023-03-20 | Discharge: 2023-03-20 | Disposition: A | Discharge status: Home / Self Care | Payer: Self-pay | Attending: Emergency Medicine | Admitting: Emergency Medicine

## 2023-03-20 ENCOUNTER — Encounter (HOSPITAL_BASED_OUTPATIENT_CLINIC_OR_DEPARTMENT_OTHER): Payer: Self-pay | Admitting: Emergency Medicine

## 2023-03-20 DIAGNOSIS — Z4802 Encounter for removal of sutures: Secondary | ICD-10-CM | POA: Insufficient documentation

## 2023-03-20 NOTE — Discharge Instructions (Signed)
Please continue to keep your wound dry and clean, apply bacitracin ointment on the wound, take tylenol/ibuprofen for pain. Please do not hesitate to return to emergency department if worrisome signs symptoms we discussed become apparent.

## 2023-03-20 NOTE — ED Triage Notes (Signed)
Pt here to get stiches removed from right hand. Placed last Monday. Denies any complications.
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
